# Patient Record
Sex: Female | Born: 1985 | Race: Black or African American | Hispanic: No | Marital: Single | Smoking: Current every day smoker
Health system: Southern US, Community
[De-identification: ages and names within clinical notes are randomized; demographics above are authoritative.]

## PROBLEM LIST (undated history)

## (undated) ENCOUNTER — Inpatient Hospital Stay (HOSPITAL_COMMUNITY): Payer: Self-pay

## (undated) DIAGNOSIS — F32A Depression, unspecified: Secondary | ICD-10-CM

## (undated) DIAGNOSIS — F419 Anxiety disorder, unspecified: Secondary | ICD-10-CM

## (undated) DIAGNOSIS — F329 Major depressive disorder, single episode, unspecified: Secondary | ICD-10-CM

## (undated) HISTORY — PX: CERVICAL BIOPSY  W/ LOOP ELECTRODE EXCISION: SUR135

---

## 2013-09-14 ENCOUNTER — Inpatient Hospital Stay
Admit: 2013-09-14 | Discharge: 2013-09-14 | Disposition: A | Discharge status: Home / Self Care | Attending: Emergency Medicine

## 2013-09-14 NOTE — ED Provider Notes (Signed)
Tina Berry, Tina Berry     DOS:           09/14/2013  MR #:             [id number]             ACCOUNT #:     [id number]  DATE OF BIRTH:    1985-05-02              AGE:           28      PROBLEM LIST:     N/V: Entered Date: 14-Sep-2013 14:42, Entered By: Carin Hock,  INTERFACES    HISTORY OF PRESENT ILLNESS:    PERTINENT HISTORY OF PRESENT  ILLNESS. Patient seen and discussed  with Dr.  Olen Pel.  Patient is a 28 year old female who presents with feeling weak,  decreased  appetite, increased urination, decreased focus the last 2-3 weeks..  Patient  was seen by the primary care doctor for which all the work up was done  as per  the patient and everything was negative except for rheumatoid factor  which was  mildly elevated.  Patient has a follow rheumatologist for the same.  Patient  has also been having some nausea and vomiting for the last one week  which the  patient states has been her best baseline considering her history of  acid  reflux.    PERTINENT PAST/ FAMILY/SOCIAL HISTORY Past medical history the  patient denies        PHYSICAL EXAM Vital signs unremarkable  Head normocephalic/atraumatic  Eyes pupils equally reactive extraocular movement intact  ENT tympanic membranes clear no rhinorrhea  Neck supple no lymphadenopathy  Cardiovascular regular rate and rhythm no murmur normal S1-S2  Respiratory no distress to auscultation bilaterally  Abdomen soft nontender nondistended  Back nontender  Extremities nontender no edema  Neuro alert oriented cranial is intact sensation intact motor normal  reflexes  normal  Psych patient has a flat effect    MEDICAL DECISION MAKING:    SIGNIFICANT FINDINGS/ED COURSE/MEDICAL DECISION MAKING/TREATMENT  PLAN The  patient's findings were very much suggestive of chronic fatigue  syndrome.  The  other differential that would consider for diabetes, pregnancy,  sexually  transmitted diseases.  Patient had a blood glucose which was 108 urine  hCG was  negative.  As  per the patient's request a urine was sent for [facility] and  Chlamydia  and the patient was told if it is positive she will be called.  The  patient was  asked to follow up with her doctor for further management.      DIAGNOSIS 1.  Polyuria  2.  Chronic fatigue      ADDITIONAL INFORMATION The Emergency Medicine attending physician  was present  in the Emergency Department, who reviewed case management, and  approved  evaluation/treatment.    COPIES SENT TO::     UnKnown, [id number](PCP): [id number]    Electronic Signatures:  Desma Maxim (MD)  (Signed 23-Sep-2013 13:17)   Co-Signer: PROBLEM LIST, HISTORY OF PRESENT ILLNESS, PHYSICAL EXAM,  MEDICAL  DECISION MAKING, DIAGNOSIS, Additional Infomation, Copies to be sent  to:  Rosalio Macadamia (MD)  (Signed 14-Sep-2013 18:08)   Authored: PROBLEM LIST, HISTORY OF PRESENT ILLNESS, PHYSICAL EXAM,  MEDICAL  DECISION MAKING, DIAGNOSIS, Additional Infomation, Copies to be sent  to:      Last Updated: 23-Sep-2013 13:17 by Desma Maxim (MD)  Please see T-Sheet, initial assessment, and physician orders for  further details.    Dictating Physician: Rosalio MacadamiaShailesh Khetarpal, MD  Original Electronic Signature Date: 09/14/2013 04:15 P  SK  Document #: [id number]    cc:  PCP No       Soarian

## 2013-10-31 LAB — POCT URINE PREGNANCY: Preg Test, Ur: NEGATIVE

## 2013-10-31 LAB — POCT URINALYSIS DIPSTICK W/O MICROSCOPE (AUTO)
Bilirubin, UA: NEGATIVE
Blood, UA POC: 200
Glucose, UA POC: NEGATIVE
Ketones, UA: 80
Leukocytes, UA: NEGATIVE
Nitrite, UA: NEGATIVE
Protein, UA POC: 30
Spec Grav, UA: 1.025
Urobilinogen, UA: 1
pH, UA: 6.5

## 2013-10-31 MED ORDER — CIPRO 500 MG PO TABS
500 | ORAL_TABLET | Freq: Two times a day (BID) | ORAL | Status: AC
Start: 2013-10-31 — End: 2013-11-05

## 2013-10-31 NOTE — Progress Notes (Signed)
Subjective:      Patient ID: Tina Berry is a 28 y.o. female.    HPI   1)  Here for possible UTI  Sxs for the past couple of weeks  + freq  + urg  + dysuria  no hematuria  No vaginal complaints: itch, discharge, bleeding  LMP: current  no abd pain - "growling, rumbling"  No back/flank pain  + feverish - chronic  + chills - chronic  + night sweats - chronic  No rash  + decreased appetite - "lost a lot of weight" - ~ 15 pounds in 30 days    + h/o UTI  Non-DM    2)  H/o IBS - bloating, constipation  Over the past week growling and rumbling    Review of Systems   Constitutional: Positive for fever, chills, diaphoresis, appetite change, fatigue and unexpected weight change.   Gastrointestinal: Positive for diarrhea, constipation and abdominal distention. Negative for nausea, vomiting, abdominal pain and blood in stool.   Endocrine:        Non-diabetic   Genitourinary: Positive for dysuria, urgency and frequency. Negative for hematuria, flank pain, vaginal bleeding, vaginal discharge, vaginal pain and pelvic pain.   Musculoskeletal: Negative for back pain.   Skin: Negative for rash.   Neurological: Negative for dizziness.   Hematological: Negative for adenopathy. Does not bruise/bleed easily.       Objective:   Physical Exam   Constitutional: She is oriented to person, place, and time. Vital signs are normal. She appears well-developed and well-nourished.  Non-toxic appearance. She does not appear ill.   Eyes: Conjunctivae are normal. No scleral icterus.   Neck: Neck supple.   Cardiovascular: Normal rate, regular rhythm and normal heart sounds.    Pulmonary/Chest: Effort normal and breath sounds normal. She has no wheezes. She has no rhonchi. She has no rales.   Abdominal: Soft. Normal appearance. She exhibits no distension and no mass. Bowel sounds are increased. There is no hepatosplenomegaly. There is tenderness in the suprapubic area. There is no rigidity, no rebound, no guarding and no CVA tenderness.    Genitourinary:   Exam deferred   Musculoskeletal: She exhibits no edema or tenderness.   Lymphadenopathy:     She has no cervical adenopathy.   Neurological: She is alert and oriented to person, place, and time.   Skin: Skin is warm and dry. No rash noted.   Psychiatric: She has a normal mood and affect. Her behavior is normal.   Nursing note and vitals reviewed.      Assessment:      1. Urinary frequency  POCT urine pregnancy    POCT Urinalysis No Micro (Auto)    ciprofloxacin (CIPRO) 500 MG TABS    Urine Culture    probable UTI   2. Weight loss  External Referral to Gastroenterology   3. IBS (irritable bowel syndrome)  External Referral to Gastroenterology            Plan:      As per orders           Kaveh Kissinger T Bessy Reaney        Follow up with PCP in 3-5 days. Call for appointment with your current PCP or please set up to establish with new PCP ASAP for appropriate follow up.

## 2013-11-02 LAB — CULTURE, URINE

## 2013-11-12 LAB — POCT URINALYSIS DIPSTICK W/O MICROSCOPE (AUTO)
Bilirubin, UA: NEGATIVE
Blood, UA POC: NEGATIVE
Glucose, UA POC: NEGATIVE
Ketones, UA: 5
Leukocytes, UA: NEGATIVE
Nitrite, UA: NEGATIVE
Protein, UA POC: NEGATIVE
Spec Grav, UA: 1.03
Urobilinogen, UA: 0.2
pH, UA: 6

## 2013-11-12 LAB — POCT URINE PREGNANCY: Preg Test, Ur: NEGATIVE

## 2013-11-12 MED ORDER — CIPROFLOXACIN HCL 500 MG PO TABS
500 MG | ORAL_TABLET | Freq: Two times a day (BID) | ORAL | Status: AC
Start: 2013-11-12 — End: 2013-11-19

## 2013-11-12 NOTE — Progress Notes (Signed)
[facility]  [facility]  [address]  [place] [id number]  Dept: [phone]  Dept Fax: [phone]  Loc: [phone]    Tina DutyBrittany L Berry is a 28 y.o. female who presents today for her medical conditions/complaints as noted below.  Tina Berry is c/o of Urinary Frequency      Chief Complaint   Patient presents with   ??? Urinary Frequency     X less than week, was seen on 07/14 for same issue.          HPI:     Urinary Frequency   This is a new problem. The current episode started 1 to 4 weeks ago. The problem occurs intermittently. The problem has been gradually worsening. The patient is experiencing no pain. Associated symptoms include frequency and urgency. Pertinent negatives include no chills, flank pain, hematuria, hesitancy, nausea, sweats or vomiting. She has tried nothing for the symptoms. The treatment provided no relief.       No past medical history on file.   Past Surgical History   Procedure Laterality Date   ??? Leep         No family history on file.    History   Substance Use Topics   ??? Smoking status: Current Every Day Smoker -- 0.80 packs/day     Types: Cigarettes   ??? Smokeless tobacco: Not on file   ??? Alcohol Use: Not on file      Current Outpatient Prescriptions   Medication Sig Dispense Refill   ??? BusPIRone HCl (BUSPAR PO) Take by mouth       ??? ciprofloxacin (CIPRO) 500 MG tablet Take 1 tablet by mouth 2 times daily for 7 days  14 tablet  0   ??? Cholecalciferol (VITAMIN D3) 1000 UNITS TABS     11   ??? Calcium Carbonate-Vitamin D (OYSTER SHELL CALCIUM/D) 500-200 MG-UNIT TABS     11   ??? Multiple Vitamin (MULTIVITAMINS PO) Take by mouth         No current facility-administered medications for this visit.     Allergies   Allergen Reactions   ??? Bactrim [Sulfamethoxazole-Tmp Ds]        Health Maintenance   Topic Date Due   ??? TETANUS VACCINE ADULT (11 YEARS AND UP)  05/24/1994   ??? CERVICAL CANCER SCREENING  05/24/2004   ??? FLU VACCINE YEARLY (ADULT)  12/19/2013        Subjective:      Review of Systems   Constitutional: Negative for chills.   Gastrointestinal: Negative for nausea and vomiting.   Genitourinary: Positive for urgency and frequency. Negative for hesitancy, hematuria and flank pain.       Objective:     Physical Exam   Constitutional: She is oriented to person, place, and time. She appears well-developed and well-nourished.   HENT:   Head: Normocephalic.   Eyes: Pupils are equal, round, and reactive to light.   Neck: Normal range of motion.   Cardiovascular: Normal rate.    Pulmonary/Chest: Effort normal.   Abdominal: Soft. There is no tenderness. There is no rebound and no guarding.   Musculoskeletal: Normal range of motion. She exhibits no tenderness.   Lymphadenopathy:     She has no cervical adenopathy.   Neurological: She is alert and oriented to person, place, and time.   Skin: Skin is warm and dry. No rash noted.     BP 145/81    Pulse 56  Temp(Src) 97.7 ??F (36.5 ??C) (Temporal)    Ht 5' (1.524 m)    Wt 118 lb (53.524 kg)    BMI 23.05 kg/m2      SpO2 99%    LMP 10/29/2013       Assessment:      The encounter diagnosis was Urinary frequency.    Plan:     Orders Placed This Encounter   Medications   ??? ciprofloxacin (CIPRO) 500 MG tablet     Sig: Take 1 tablet by mouth 2 times daily for 7 days     Dispense:  14 tablet     Refill:  0       Orders Placed This Encounter   Procedures   ??? Urine Culture     Order Specific Question:  Specify (ex-cath, midstream, cysto, etc)?     Answer:  midstream   ??? POCT urine pregnancy   ??? POCT Urinalysis No Micro (Auto)     To follow up with PCP, GYN or urologist if symptoms persist.  Explained if culture is negative again will need further eval by specialist  Patient given educational materials - see patient instructions.  Discussed use, benefit, and side effects of prescribed medications.  All patient questions answered.  Pt voiced understanding. Patient advised to follow up with primary care physician for all health  maintenance concerns and follow up as appropriate.  Instructed to continue current medications, diet and exercise.  Patient agreed with treatment plan. Follow up as directed.     Electronically signed by Juluis Rainierory A Hiral Lukasiewicz, DO on 11/12/2013 at 3:27 PM

## 2013-11-12 NOTE — Patient Instructions (Signed)
Frequent Urination: After Your Visit  Your Care Instructions  An urge to urinate frequently but usually passing only small amounts of urine is a common symptom of urinary problems, such as urinary tract infections. The bladder may become inflamed. This can cause the urge to urinate. You may try to urinate more often than usual to try to soothe that urge.  Frequent urination also may be caused by sexually transmitted infections (STIs) or kidney stones. Or it may happen when something irritates the tube that carries urine from the bladder to the outside of the body (urethra). It may also be a sign of diabetes.  The cause may be hard to find. You may need tests.  Follow-up care is a key part of your treatment and safety. Be sure to make and go to all appointments, and call your doctor if you are having problems. It's also a good idea to know your test results and keep a list of the medicines you take.  How can you care for yourself at home?  ?? Drink extra water and juices such as cranberry and blueberry juices for the next day or two. This will help make the urine less concentrated. And it may help wash out any bacteria that may be causing an infection. (If you have kidney, heart, or liver disease and have to limit fluids, talk with your doctor before you increase the amount of fluids you drink.)  ?? Avoid drinks that are carbonated or have caffeine. They can irritate the bladder.  For women:  ?? Urinate right after you have sex.  ?? After you go to the bathroom, wipe from front to back.  ?? Avoid douches, bubble baths, and feminine hygiene sprays. And avoid other feminine hygiene products that have deodorants.  When should you call for help?  Call your doctor now or seek immediate medical care if:  ?? You have new symptoms, such as fever, nausea, or vomiting.  ?? You have new or worse symptoms of a urinary problem. For example:  ?? You have blood or pus in your urine.  ?? You have chills or body aches.  ?? It hurts to  urinate.  ?? You have groin or belly pain.  ?? You have pain in your back just below your rib cage (the flank area).  Watch closely for changes in your health, and be sure to contact your doctor if you feel thirstier than usual.   Where can you learn more?   Go to [email or web address] and sign in to your MyChart account. Enter I431 in the Search Health Information box to learn more about ???Frequent Urination: After Your Visit.???    If you do not have an account, please click on the ???Sign Up Now??? link.     ?? 2006-2015 [employer]. Care instructions adapted under license by [facility]. This care instruction is for use with your licensed healthcare professional. If you have questions about a medical condition or this instruction, always ask your healthcare professional. Health[employer], Incorporated disclaims any warranty or liability for your use of this information.  Content Version: 10.5.422740; Current as of: December 27, 2012            To follow up with PCP, GYN or urologist if symptoms persist.

## 2013-11-14 LAB — CULTURE, URINE

## 2014-01-08 MED ORDER — PANTOPRAZOLE SODIUM 40 MG PO TBEC
40 MG | ORAL_TABLET | Freq: Every day | ORAL | Status: AC
Start: 2014-01-08 — End: ?

## 2014-01-08 NOTE — Progress Notes (Signed)
This patient presents with complaints of problems with stomach growling and movement in her epigastrium with rectal contractions.  She has had these for the last two months.  It does not matter if her stomach is empty or full. She has not had any testing for this.  They did not put the patent on any medications.  She also brought in an note from [employer] were she sees her PCP which I reviewed but there was not any additional information.  She does sometimes have vomiting without nausea in the morning after she awakens.  She just brings [place] a yellow clear fluid. She denies dysphagia or odynophagia.  She has not lost any weight but admits to having fluctuations in her weight. .  She denies any problems with her bowel movements. She has not had diarrhea, constipation or rectal bleeding.  I reviewed her referral note from Dr. Irean Hong .  He saw the patient on 10/31/13.  She complained of decreased appetite with a 15 pound weight loss in 30 days, growling and vomiting and her stomach and constipation and bloating.  She is referred to GI for this reason. When the patient is depressed she does not eat. She does take ibuprofen PM daily to help her to sleep. The patient has been checked for STD and HIV and tells me these were negative.  There is reference to this in her last visit with her primary caregiver.     Impression:  1.This patient presents with epigastric pain and vomiting.And weight loss.  She should have upper endoscopy to evaluate for peptic ulcer disease, gastritis, and gastric outlet obstruction. It is also possible this is functional in nature the patient does have a lot of depression and anxiety problems    Recommendations:  1.Upper endoscopy  with possible biopsy, polypectomy, dilation, control of bleeding.  I discussed with the patient about the risks, benefits, alternatives to this procedure to include discussion of the risks of bleeding, infection, perforation, aspiration, missed abnormality,  cardiopulmonary arrest, death, disability and possibility of need for emergency surgery her life support in the event of a complication.  I answered all of the patient's questions, they clearly understand all of the above and give there informed consent to proceed.  2.  Start pantoprazole 40 mg a day    Review of Systems:  HEENT, cardiac, pulmonary, GI, GU, or genitourinary, hematologic lymphatic, psychiatric, integumentary and constitutional review of systems negative except those issues this above    Physical Exam:  General: Well-developed well-nourished patient who is a alert and oriented x3.  HEENT: Head is normocephalic atraumatic Eyes with pupils equal and reactive to light and accommodation, sclera anicteric.  Nares without any lesions in the nares.  Mouth with moist mucous membranes no lesions of the hard and soft palate and posterior pharynx.  Neck supple, nontender, without any thyromegaly or lymphadenopathy.  Pulmonary: Lungs bilaterally normal to percussion and clear to auscultation  Cardiac: Heart is regular rate and rhythm without any rubs or clicks murmurs or JVD or edema  Abdomen: Abdomen is not distended and normal in appearance.  The abdomen has normal bowel sounds.  The abdomen is mildly tender to palpation in the epigastrium, it is soft without hepatosplenomegaly masses rebound or rigidity.  Rectal exam: Not performed at this time  Extremities: No lesions, edema  Skin: without any lesions  Neurologic: No focal deficits

## 2014-02-26 ENCOUNTER — Ambulatory Visit
Admit: 2014-02-26 | Discharge: 2014-02-26 | Payer: PRIVATE HEALTH INSURANCE | Attending: Rheumatology | Primary: Family Medicine

## 2014-02-26 DIAGNOSIS — M791 Myalgia, unspecified site: Secondary | ICD-10-CM

## 2014-02-26 MED ORDER — MELOXICAM 15 MG PO TABS
15 MG | ORAL_TABLET | Freq: Every day | ORAL | Status: AC
Start: 2014-02-26 — End: ?

## 2014-02-26 NOTE — Progress Notes (Signed)
Subjective:      Patient ID: Tina Berry is a 28 y.o. female.    HPI  28 yo F referred by Dr. Alfonse RasMoreno for "immune issues" and problems with blood.  She had labs done due to the onset of myalgias, arthralgias, hair loss and photosensitivity.  She denies any swollen joints, AM stiffness but her pain is now worse with the colder weather.  No h/o serositis, kidney issues, DVT/PE, miscarriages.  Uses Tylenol daily which helps but only for an hour or so.  Review of Systems  ROS as below is negative unless amended or stated in HPI:    Constitutional: weight gain, weight loss, fatigue, night sweats, fever  Eyes: pain, redness, loss of vision, double or blurred vision, +dryness, history of iritis of uveitis  Ears/Nose/Mouth/Throat:  sores in mouth, +dryness of mouth, difficulty swallowing, hoarseness, loss of hearing, nosebleeds, loss of smell, loss of taste, frequent sore throats  Cardiovascular: chest pain, high blood pressure, irregular heart beat, palpitations, heart murmurs  Respiratory: +shortness of breath, dyspnea on exertion, cough, hemoptysis, history of serositis  Gastrointestinal: nausea, vomiting, abdominal pain, diarrhea, constipation, blood in stool, heartburn  Genitourinary: blood in urine, rash/ulcers, difficulty urinating, sexual difficulties  Women only: vaginal dryness, history of miscarriages  Musculoskeletal: morning stiffness, +joint pain, joint swelling, joint stiffness, muscle tenderness  Skin: +easy bruising, rash, psoriasis, +photosensitivity, hair loss, color changes of hands and feet in the cold, hives  Neurologic: headaches, dizziness, weakness, numbness and tingling  Psychiatric: +anxiety, depression, +agitation, easily losing temper, +difficulty falling asleep, difficulty staying asleep  Hematologic/lymphatic: anemia, bleeding tendency, swollen glands  Immunologic: increased susceptibility to infection, recurrent infections      Objective:   Physical Exam   Constitutional: She is  oriented to person, place, and time. She appears well-developed and well-nourished.   HENT:   Head: Normocephalic and atraumatic.   Mouth/Throat: Mucous membranes are normal. No oral lesions. No oropharyngeal exudate.   Neck: Normal range of motion. Neck supple. No thyromegaly present.   Cardiovascular: Normal rate, regular rhythm and normal heart sounds.    Pulmonary/Chest: Effort normal and breath sounds normal.   Musculoskeletal: Normal range of motion.   No synovitis of any joints, TTP over PIPs R>L hands, minimal tenderness of some DIPs, no pain of MCPs, negative tender point survey   Neurological: She is alert and oriented to person, place, and time. She has normal strength.   Skin: Skin is warm and dry. No rash noted.   Psychiatric: She has a normal mood and affect. Her behavior is normal.       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

## 2014-02-27 LAB — COMPREHENSIVE METABOLIC PANEL
ALT: 26 U/L (ref 13–61)
AST: 17 U/L (ref 0–31)
Albumin,Serum: 4.1 G/dL (ref 3.4–5.0)
Albumin/Globulin Ratio: 1.2 RATIO (ref 1.0–2.5)
Alkaline Phosphatase: 56 U/L (ref 45–117)
Anion Gap: 5 mmol/L — ABNORMAL LOW (ref 6–16)
BUN/Creatinine Ratio: 18.9 RATIO (ref 6.0–20.0)
BUN: 17 mG/dL (ref 7–25)
CO2: 27 mmol/L (ref 21–31)
Calcium: 9.9 mG/dL (ref 8.2–10.5)
Chloride: 107 mmol/L (ref 98–109)
Creatinine: 0.9 mG/dL (ref 0.60–1.50)
GFR African American: >60 mL/min/{1.73_m2}
GFR Non-African American: >60 mL/min/{1.73_m2}
Globulin: 3.3 G/dL (ref 2.4–4.1)
Glucose: 92 mG/dL (ref 70–100)
Potassium: 4.8 mmol/L (ref 3.5–5.0)
Sodium: 139 mmol/L (ref 135–145)
Total Bilirubin: 0.3 mG/dL (ref 0.3–1.0)
Total Protein: 7.4 G/dL (ref 6.4–8.3)

## 2014-02-27 LAB — CBC WITH AUTO DIFFERENTIAL
Basophils %: 1 %
Basophils Absolute: 0.1 10*3/uL (ref 0.0–0.1)
Eosinophils %: 1.4 %
Eosinophils Absolute: 0.1 10*3/uL (ref 0.0–0.5)
Hematocrit: 43.5 % (ref 35–47)
Hemoglobin: 13.9 g/dl (ref 11.7–16.0)
Lymphocytes %: 26.5 %
Lymphocytes Absolute: 1.9 10*3/uL (ref 1.0–4.3)
MCH: 28.8 pg (ref 26–34)
MCHC: 31.9 g/dl — ABNORMAL LOW (ref 32–36)
MCV: 90.3 fL (ref 79–98)
MPV: 9.3 fL (ref 7.4–10.4)
Monocytes %: 8.8 %
Monocytes Absolute: 0.6 10*3/uL (ref 0.0–0.8)
Neutrophils Absolute: 4.5 10*3/uL (ref 1.8–7.0)
Platelets: 226 10*3/uL (ref 140–440)
RBC: 4.82 mil/uL (ref 3.8–5.2)
RDW: 13.9 % (ref 11.5–14.5)
Segs Relative: 62.3 %
WBC: 7.2 10*3/uL (ref 3.6–10.7)

## 2014-02-27 LAB — SEDIMENTATION RATE: Sed Rate: 8 mm/hr (ref 0–20)

## 2014-02-27 LAB — ANA: ANA: <1:40 {titer}

## 2014-02-27 LAB — RHEUMATOID FACTOR: Rheumatoid Factor: <10 IU/mL (ref 0–14)

## 2014-02-27 LAB — C4 COMPLEMENT: C4 Complement: 24.5 mG/dL (ref 10–40)

## 2014-02-27 LAB — URINALYSIS, MICRO
Epithelial Cells, UA: 0 /LPF
Erythrocytes, Urine: 0 /HPF (ref 0–3)
LEUKOCYTES, UA: 1 /HPF (ref 0–5)

## 2014-02-27 LAB — CK: Total CK: 183 U/L — ABNORMAL HIGH (ref 16–134)

## 2014-02-27 LAB — C-REACTIVE PROTEIN: wr-CRP: <1 mG/L (ref 0.0–5.0)

## 2014-02-27 LAB — C3 COMPLEMENT: C3 Complement: 107 mG/dL (ref 90–180)

## 2014-03-01 LAB — CYCLIC CITRUL PEPTIDE ANTIBODY, IGG: CC Peptide,IgG Ab: 2 Units (ref 0–19)

## 2014-03-01 LAB — EXTRACTABLE NUCLEAR AG ABS 5
ENA RNP Ab: 0 AU/mL (ref 0–40)
ENA SS-A IgG Ab Serum: 8 AU/mL (ref 0–40)
ENA SSA 60kD Ab: 32 AU/mL (ref 0–40)
SSB [La]: 0 AU/mL (ref 0–40)
Smith (ENA) Ab, IgG: 0 AU/mL (ref 0–40)

## 2014-03-01 LAB — ANTI-DNA ANTIBODY, DOUBLE-STRANDED: dsDNA Ab: NOT DETECTED

## 2014-03-20 NOTE — Telephone Encounter (Signed)
Pt notified of lab results.

## 2014-03-20 NOTE — Telephone Encounter (Signed)
Would like lab results

## 2014-03-20 NOTE — Telephone Encounter (Signed)
All tests negative/normal

## 2015-04-26 ENCOUNTER — Encounter (HOSPITAL_COMMUNITY): Payer: Self-pay

## 2015-04-26 ENCOUNTER — Emergency Department (HOSPITAL_COMMUNITY)
Admission: EM | Admit: 2015-04-26 | Discharge: 2015-04-27 | Discharge status: Against Medical Advice | Payer: Medicaid - Out of State | Attending: Emergency Medicine | Admitting: Emergency Medicine

## 2015-04-26 DIAGNOSIS — R1110 Vomiting, unspecified: Secondary | ICD-10-CM | POA: Insufficient documentation

## 2015-04-26 DIAGNOSIS — R52 Pain, unspecified: Secondary | ICD-10-CM | POA: Insufficient documentation

## 2015-04-26 DIAGNOSIS — F17210 Nicotine dependence, cigarettes, uncomplicated: Secondary | ICD-10-CM | POA: Diagnosis not present

## 2015-04-26 DIAGNOSIS — R350 Frequency of micturition: Secondary | ICD-10-CM | POA: Diagnosis not present

## 2015-04-26 DIAGNOSIS — Z5321 Procedure and treatment not carried out due to patient leaving prior to being seen by health care provider: Secondary | ICD-10-CM

## 2015-04-26 LAB — URINALYSIS, ROUTINE W REFLEX MICROSCOPIC
Bilirubin Urine: NEGATIVE
GLUCOSE, UA: NEGATIVE mg/dL
Ketones, ur: 15 mg/dL — AB
LEUKOCYTES UA: NEGATIVE
NITRITE: NEGATIVE
PH: 6 (ref 5.0–8.0)
PROTEIN: NEGATIVE mg/dL
Specific Gravity, Urine: 1.004 — ABNORMAL LOW (ref 1.005–1.030)

## 2015-04-26 LAB — URINE MICROSCOPIC-ADD ON

## 2015-04-26 NOTE — ED Notes (Signed)
Second call for blood draw,no answer.

## 2015-04-26 NOTE — ED Notes (Signed)
No answer for blood draw at this time. 

## 2015-04-26 NOTE — ED Notes (Signed)
Pt c/o generalized body aches, urinary frequency, and emesis after eating (50% of the time) x 3 days.  Pain score 5/10.  Pt has not taken anything for symptoms.  Denies abdominal pain.

## 2015-05-02 NOTE — ED Provider Notes (Signed)
Per review of the nursing notes it seems the patient left without being seen.   Marily MemosJason Amiel Mccaffrey, MD 05/02/15 1630

## 2016-04-20 NOTE — L&D Delivery Note (Signed)
Delivery Note At 2:44 PM a viable female was delivered via Vaginal, Spontaneous Delivery (Presentation:OA)  APGAR: 6/8 per NICU. Delivered without difficulty over an intact perineum.  3VC, Placenta intact, sent to pathology.    Anesthesia:  epidural Episiotomy: None Lacerations: None Est. Blood Loss (mL): 50 Fundus firm Mom to postpartum.  Baby to NICU.  Kathlene NovemberCinthya Bowmaker-Kareem, SNM 02/14/2017, 2:59 PM I was present for this delivery and agree with above assessment

## 2016-08-06 DIAGNOSIS — F17210 Nicotine dependence, cigarettes, uncomplicated: Secondary | ICD-10-CM | POA: Diagnosis not present

## 2016-08-06 DIAGNOSIS — Z79899 Other long term (current) drug therapy: Secondary | ICD-10-CM | POA: Diagnosis not present

## 2016-08-06 DIAGNOSIS — H1089 Other conjunctivitis: Secondary | ICD-10-CM | POA: Insufficient documentation

## 2016-08-06 DIAGNOSIS — H109 Unspecified conjunctivitis: Secondary | ICD-10-CM | POA: Diagnosis present

## 2016-08-07 ENCOUNTER — Emergency Department (HOSPITAL_COMMUNITY)
Admission: EM | Admit: 2016-08-07 | Discharge: 2016-08-07 | Disposition: A | Discharge status: Home / Self Care | Payer: Medicaid Other | Attending: Emergency Medicine | Admitting: Emergency Medicine

## 2016-08-07 ENCOUNTER — Encounter (HOSPITAL_COMMUNITY): Payer: Self-pay | Admitting: Emergency Medicine

## 2016-08-07 DIAGNOSIS — H109 Unspecified conjunctivitis: Secondary | ICD-10-CM

## 2016-08-07 MED ORDER — TETRACAINE HCL 0.5 % OP SOLN
2.0000 [drp] | Freq: Once | OPHTHALMIC | Status: AC
  Administered 2016-08-07: 2 [drp] via OPHTHALMIC
  Filled 2016-08-07: qty 2

## 2016-08-07 MED ORDER — FLUORESCEIN SODIUM 0.6 MG OP STRP
1.0000 | ORAL_STRIP | Freq: Once | OPHTHALMIC | Status: AC
  Administered 2016-08-07: 1 via OPHTHALMIC
  Filled 2016-08-07: qty 1

## 2016-08-07 MED ORDER — ERYTHROMYCIN 5 MG/GM OP OINT: TOPICAL_OINTMENT | OPHTHALMIC | 0 refills | Status: DC

## 2016-08-07 NOTE — ED Notes (Signed)
Pt has been using stye medication with no relief.

## 2016-08-07 NOTE — ED Triage Notes (Addendum)
Patient reports right eye redness/irritation with drainage and lacrimation onset yesterday morning , denies injury or vision loss. Patient stated that she is [redacted] weeks pregnant.

## 2016-08-07 NOTE — ED Provider Notes (Signed)
[facility]-EMERGENCY DEPT Provider Note   CSN: [id number] Arrival date & time: 08/06/16  2356     History   Chief Complaint Chief Complaint  Patient presents with  . Conjunctivitis    HPI Erica Kelly is a 31 y.o. female.  Pt presents w acute onset R eye burning that began early Thursday morning. Pt reports she attempted to put in her contact lense and immediately felt a burning sensation in her entire eye. Reports R eye is red and she has had yellow mucus drainage from that eye. Denies vision loss or changes in vision, pain w eye movement, HA, F/C. She is currently [redacted] weeks pregnant.      History reviewed. No pertinent past medical history.  There are no active problems to display for this patient.   Past Surgical History:  Procedure Laterality Date  . CERVICAL BIOPSY  W/ LOOP ELECTRODE EXCISION      OB History    Gravida Para Term Preterm AB Living   1             SAB TAB Ectopic Multiple Live Births                   Home Medications    Prior to Admission medications   Medication Sig Start Date End Date Taking? Authorizing Provider  erythromycin ophthalmic ointment Place a 1/2 inch ribbon of ointment into the lower eyelid 4 times daily for 7 days. 08/07/16   [place] N Russo, PA-C  Multiple Vitamin (MULTIVITAMIN WITH MINERALS) TABS tablet Take 1 tablet by mouth daily.    Historical Provider, MD  polyvinyl alcohol (LIQUIFILM TEARS) 1.4 % ophthalmic solution Place 1 drop into both eyes daily as needed for dry eyes.    Historical Provider, MD    Family History No family history on file.  Social History Social History  Substance Use Topics  . Smoking status: Current Every Day Smoker    Types: Cigarettes  . Smokeless tobacco: Never Used  . Alcohol use Yes     Allergies   Bactrim [sulfamethoxazole-trimethoprim]   Review of Systems Review of Systems  Constitutional: Negative for chills and fever.  Eyes: Positive for pain (R eye, burning), discharge  (yellow, mucus) and redness (R eye). Negative for photophobia and visual disturbance.     Physical Exam Updated Vital Signs BP 121/75 (BP Location: Right Arm)   Pulse 84   Temp 99 F (37.2 C) (Oral)   Resp 18   SpO2 99%   Physical Exam  Constitutional: She appears well-developed and well-nourished.  HENT:  Head: Normocephalic and atraumatic.  Eyes: EOM are normal. Pupils are equal, round, and reactive to light. Lids are everted and swept, no foreign bodies found. Right eye exhibits discharge. Right eye exhibits no hordeolum. No foreign body present in the right eye. Left eye exhibits no discharge and no exudate. Right conjunctiva is injected. Right eye exhibits normal extraocular motion. Left eye exhibits normal extraocular motion.  Wood's lamp and fluorescein staining showed no evidence of corneal abrasion or ulcer. No foreign bodies.  Cardiovascular: Normal rate.   Pulmonary/Chest: Effort normal.  Psychiatric: She has a normal mood and affect. Her behavior is normal.  Nursing note and vitals reviewed.    ED Treatments / Results  Labs (all labs ordered are listed, but only abnormal results are displayed) Labs Reviewed - No data to display  EKG  EKG Interpretation None       Radiology No results found.  Procedures Procedures (including  critical care time)  Medications Ordered in ED Medications  fluorescein ophthalmic strip 1 strip (1 strip Right Eye Given by Other 08/07/16 0201)  tetracaine (PONTOCAINE) 0.5 % ophthalmic solution 2 drop (2 drops Right Eye Given by Other 08/07/16 0201)     Initial Impression / Assessment and Plan / ED Course  I have reviewed the triage vital signs and the nursing notes.  Pertinent labs & imaging results that were available during my care of the patient were reviewed by me and considered in my medical decision making (see chart for details).     Patient presentation consistent with bacterial conjunctivitis.  Purulent discharge  noted.  No corneal abrasions, entrapment, consensual photophobia, or dendritic staining with fluorescein study.  Presentation non-concerning for iritis, corneal abrasions, or HSV.  Pt is a contact lense wearer. Due to pt's pregnancy and bactrim allergy, prescribed erythromycin ophthalmic pointment. Personal hygiene and frequent handwashing discussed.  Patient advised to followup with ophthalmologist if symptoms persist or worsen in any way including vision change or purulent discharge.  Patient verbalizes understanding and is agreeable with discharge.  Discussed results, findings, treatment and follow up. Patient advised of return precautions. Patient verbalized understanding and agreed with plan.    Final Clinical Impressions(s) / ED Diagnoses   Final diagnoses:  Bacterial conjunctivitis of right eye    New Prescriptions Discharge Medication List as of 08/07/2016  2:15 AM    START taking these medications   Details  erythromycin ophthalmic ointment Place a 1/2 inch ribbon of ointment into the lower eyelid 4 times daily for 7 days., Print         [place] N Russo, PA-C 08/07/16 [id number]    Jacalyn Lefevre, MD 08/07/16 [phone]

## 2016-08-07 NOTE — Discharge Instructions (Signed)
Please read instructions below. Apply 1/2inch ribbon on ointment to the lower lid of your right eye 4 times per day. Maintain good hand hygiene. Do not wear your contact lenses until resolved. Discard your current pair of contact lenses and do not wear them again. Return to ER for vision loss, worsening symptoms, fever.

## 2016-08-14 ENCOUNTER — Encounter (HOSPITAL_COMMUNITY): Payer: Self-pay

## 2016-08-14 ENCOUNTER — Inpatient Hospital Stay (HOSPITAL_COMMUNITY)
Admission: AD | Admit: 2016-08-14 | Discharge: 2016-08-14 | Disposition: A | Discharge status: Home / Self Care | Payer: Medicaid Other | Source: Ambulatory Visit | Attending: Obstetrics and Gynecology | Admitting: Obstetrics and Gynecology

## 2016-08-14 ENCOUNTER — Inpatient Hospital Stay (HOSPITAL_COMMUNITY): Payer: Medicaid Other

## 2016-08-14 DIAGNOSIS — Z882 Allergy status to sulfonamides status: Secondary | ICD-10-CM | POA: Insufficient documentation

## 2016-08-14 DIAGNOSIS — Z3A01 Less than 8 weeks gestation of pregnancy: Secondary | ICD-10-CM | POA: Insufficient documentation

## 2016-08-14 DIAGNOSIS — G4700 Insomnia, unspecified: Secondary | ICD-10-CM | POA: Diagnosis not present

## 2016-08-14 DIAGNOSIS — O99352 Diseases of the nervous system complicating pregnancy, second trimester: Secondary | ICD-10-CM | POA: Diagnosis not present

## 2016-08-14 DIAGNOSIS — R0602 Shortness of breath: Secondary | ICD-10-CM | POA: Diagnosis present

## 2016-08-14 DIAGNOSIS — O26892 Other specified pregnancy related conditions, second trimester: Secondary | ICD-10-CM | POA: Diagnosis not present

## 2016-08-14 DIAGNOSIS — O26899 Other specified pregnancy related conditions, unspecified trimester: Secondary | ICD-10-CM | POA: Diagnosis not present

## 2016-08-14 DIAGNOSIS — R109 Unspecified abdominal pain: Secondary | ICD-10-CM

## 2016-08-14 DIAGNOSIS — Z3491 Encounter for supervision of normal pregnancy, unspecified, first trimester: Secondary | ICD-10-CM

## 2016-08-14 HISTORY — DX: Anxiety disorder, unspecified: F41.9

## 2016-08-14 LAB — URINALYSIS, ROUTINE W REFLEX MICROSCOPIC
Bilirubin Urine: NEGATIVE
GLUCOSE, UA: NEGATIVE mg/dL
Hgb urine dipstick: NEGATIVE
KETONES UR: 80 mg/dL — AB
LEUKOCYTES UA: NEGATIVE
Nitrite: NEGATIVE
PROTEIN: NEGATIVE mg/dL
Specific Gravity, Urine: 1.026 (ref 1.005–1.030)
pH: 6 (ref 5.0–8.0)

## 2016-08-14 LAB — CBC
HCT: 40.9 % (ref 36.0–46.0)
Hemoglobin: 14.3 g/dL (ref 12.0–15.0)
MCH: 30.2 pg (ref 26.0–34.0)
MCHC: 35 g/dL (ref 30.0–36.0)
MCV: 86.3 fL (ref 78.0–100.0)
PLATELETS: 227 10*3/uL (ref 150–400)
RBC: 4.74 MIL/uL (ref 3.87–5.11)
RDW: 13.2 % (ref 11.5–15.5)
WBC: 9.8 10*3/uL (ref 4.0–10.5)

## 2016-08-14 LAB — HEPATITIS B SURFACE ANTIGEN: Hepatitis B Surface Ag: NEGATIVE

## 2016-08-14 LAB — HCG, QUANTITATIVE, PREGNANCY: hCG, Beta Chain, Quant, S: 70962 m[IU]/mL — ABNORMAL HIGH (ref <5)

## 2016-08-14 LAB — WET PREP, GENITAL
Clue Cells Wet Prep HPF POC: NONE SEEN
Sperm: NONE SEEN
Trich, Wet Prep: NONE SEEN
Yeast Wet Prep HPF POC: NONE SEEN

## 2016-08-14 LAB — POCT PREGNANCY, URINE: Preg Test, Ur: POSITIVE — AB

## 2016-08-14 LAB — ABO/RH: ABO/RH(D): B POS
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
## 2016-08-14 NOTE — Discharge Instructions (Signed)
Insomnia Insomnia is a sleep disorder that makes it difficult to fall asleep or to stay asleep. Insomnia can cause tiredness (fatigue), low energy, difficulty concentrating, mood swings, and poor performance at work or school. There are three different ways to classify insomnia:  Difficulty falling asleep.  Difficulty staying asleep.  Waking up too early in the morning. Any type of insomnia can be long-term (chronic) or short-term (acute). Both are common. Short-term insomnia usually lasts for three months or less. Chronic insomnia occurs at least three times a week for longer than three months. What are the causes? Insomnia may be caused by another condition, situation, or substance, such as:  Anxiety.  Certain medicines.  Gastroesophageal reflux disease (GERD) or other gastrointestinal conditions.  Asthma or other breathing conditions.  Restless legs syndrome, sleep apnea, or other sleep disorders.  Chronic pain.  Menopause. This may include hot flashes.  Stroke.  Abuse of alcohol, tobacco, or illegal drugs.  Depression.  Caffeine.  Neurological disorders, such as Alzheimer disease.  An overactive thyroid (hyperthyroidism). The cause of insomnia may not be known. What increases the risk? Risk factors for insomnia include:  Gender. Women are more commonly affected than men.  Age. Insomnia is more common as you get older.  Stress. This may involve your professional or personal life.  Income. Insomnia is more common in people with lower income.  Lack of exercise.  Irregular work schedule or night shifts.  Traveling between different time zones. What are the signs or symptoms? If you have insomnia, trouble falling asleep or trouble staying asleep is the main symptom. This may lead to other symptoms, such as:  Feeling fatigued.  Feeling nervous about going to sleep.  Not feeling rested in the morning.  Having trouble concentrating.  Feeling irritable,  anxious, or depressed. How is this treated? Treatment for insomnia depends on the cause. If your insomnia is caused by an underlying condition, treatment will focus on addressing the condition. Treatment may also include:  Medicines to help you sleep.  Counseling or therapy.  Lifestyle adjustments. Follow these instructions at home:  Take medicines only as directed by your health care provider.  Keep regular sleeping and waking hours. Avoid naps.  Keep a sleep diary to help you and your health care provider figure out what could be causing your insomnia. Include:  When you sleep.  When you wake up during the night.  How well you sleep.  How rested you feel the next day.  Any side effects of medicines you are taking.  What you eat and drink.  Make your bedroom a comfortable place where it is easy to fall asleep:  Put up shades or special blackout curtains to block light from outside.  Use a white noise machine to block noise.  Keep the temperature cool.  Exercise regularly as directed by your health care provider. Avoid exercising right before bedtime.  Use relaxation techniques to manage stress. Ask your health care provider to suggest some techniques that may work well for you. These may include:  Breathing exercises.  Routines to release muscle tension.  Visualizing peaceful scenes.  Cut back on alcohol, caffeinated beverages, and cigarettes, especially close to bedtime. These can disrupt your sleep.  Do not overeat or eat spicy foods right before bedtime. This can lead to digestive discomfort that can make it hard for you to sleep.  Limit screen use before bedtime. This includes:  Watching TV.  Using your smartphone, tablet, and computer.  Stick to a   routine. This can help you fall asleep faster. Try to do a quiet activity, brush your teeth, and go to bed at the same time each night.  Get out of bed if you are still awake after 15 minutes of trying to  sleep. Keep the lights down, but try reading or doing a quiet activity. When you feel sleepy, go back to bed.  Make sure that you drive carefully. Avoid driving if you feel very sleepy.  Keep all follow-up appointments as directed by your health care provider. This is important. Contact a health care provider if:  You are tired throughout the day or have trouble in your daily routine due to sleepiness.  You continue to have sleep problems or your sleep problems get worse. Get help right away if:  You have serious thoughts about hurting yourself or someone else. This information is not intended to replace advice given to you by your health care provider. Make sure you discuss any questions you have with your health care provider. Document Released: 04/03/2000 Document Revised: 09/06/2015 Document Reviewed: 01/05/2014 Elsevier Interactive Patient Education  2017 [employer]  

## 2016-08-14 NOTE — MAU Note (Signed)
Pt presents to MAU with complaints of lower abdominal cramping, and states that she feels like she is not able to concentrate. PT also states she feels short of breath. Denies any vaginal bleeding or abnormal discharge

## 2016-08-15 ENCOUNTER — Encounter (HOSPITAL_COMMUNITY): Payer: Self-pay | Admitting: Emergency Medicine

## 2016-08-15 ENCOUNTER — Emergency Department (HOSPITAL_COMMUNITY)
Admission: EM | Admit: 2016-08-15 | Discharge: 2016-08-16 | Disposition: A | Discharge status: Home / Self Care | Payer: Medicaid Other | Attending: Emergency Medicine | Admitting: Emergency Medicine

## 2016-08-15 DIAGNOSIS — F17210 Nicotine dependence, cigarettes, uncomplicated: Secondary | ICD-10-CM | POA: Insufficient documentation

## 2016-08-15 DIAGNOSIS — R0601 Orthopnea: Secondary | ICD-10-CM | POA: Insufficient documentation

## 2016-08-15 DIAGNOSIS — R072 Precordial pain: Secondary | ICD-10-CM | POA: Insufficient documentation

## 2016-08-15 DIAGNOSIS — Z79899 Other long term (current) drug therapy: Secondary | ICD-10-CM | POA: Diagnosis not present

## 2016-08-15 DIAGNOSIS — R0602 Shortness of breath: Secondary | ICD-10-CM | POA: Diagnosis present

## 2016-08-15 LAB — HIV ANTIBODY (ROUTINE TESTING W REFLEX): HIV Screen 4th Generation wRfx: NONREACTIVE

## 2016-08-15 LAB — RPR: RPR Ser Ql: NONREACTIVE

## 2016-08-15 NOTE — ED Triage Notes (Signed)
Pt from [facility] (due to S/I and depression) states she has had SOB and "chest tightness" for three days when she lies down.  At this time she is not labored in her breathing.  She reports she is [redacted]weeks pregnant.

## 2016-08-16 ENCOUNTER — Emergency Department (HOSPITAL_COMMUNITY): Payer: Medicaid Other

## 2016-08-16 LAB — BASIC METABOLIC PANEL
ANION GAP: 11 (ref 5–15)
BUN: 7 mg/dL (ref 6–20)
CHLORIDE: 107 mmol/L (ref 101–111)
CO2: 17 mmol/L — ABNORMAL LOW (ref 22–32)
Calcium: 8.9 mg/dL (ref 8.9–10.3)
Creatinine, Ser: 0.63 mg/dL (ref 0.44–1.00)
GFR calc Af Amer: >60 mL/min (ref >60)
GFR calc non Af Amer: >60 mL/min (ref >60)
Glucose, Bld: 81 mg/dL (ref 65–99)
POTASSIUM: 3.5 mmol/L (ref 3.5–5.1)
SODIUM: 135 mmol/L (ref 135–145)

## 2016-08-16 LAB — CBC
HCT: 38.4 % (ref 36.0–46.0)
HEMOGLOBIN: 13.3 g/dL (ref 12.0–15.0)
MCH: 29.4 pg (ref 26.0–34.0)
MCHC: 34.6 g/dL (ref 30.0–36.0)
MCV: 84.8 fL (ref 78.0–100.0)
Platelets: 227 10*3/uL (ref 150–400)
RBC: 4.53 MIL/uL (ref 3.87–5.11)
RDW: 12.6 % (ref 11.5–15.5)
WBC: 11 10*3/uL — AB (ref 4.0–10.5)

## 2016-08-16 LAB — I-STAT TROPONIN, ED: Troponin i, poc: 0 ng/mL (ref 0.00–0.08)

## 2016-08-16 MED ORDER — IBUPROFEN 600 MG PO TABS: 600.0000 mg | ORAL_TABLET | Freq: Three times a day (TID) | ORAL | 0 refills | Status: DC

## 2016-08-16 MED ORDER — ALBUTEROL SULFATE HFA 108 (90 BASE) MCG/ACT IN AERS
2.0000 | INHALATION_SPRAY | Freq: Once | RESPIRATORY_TRACT | Status: AC
  Administered 2016-08-16: 2 via RESPIRATORY_TRACT
  Filled 2016-08-16: qty 6.7

## 2016-08-16 NOTE — ED Notes (Signed)
Pulse Ox during ambulation remained at 100%

## 2016-08-16 NOTE — ED Notes (Signed)
Called and gave report to [facility] and [facility] for transport.

## 2016-08-16 NOTE — Discharge Instructions (Signed)
Read the information below.  Use the prescribed medication as directed.  Please discuss all new medications with your pharmacist.  You may return to the Emergency Department at any time for worsening condition or any new symptoms that concern you.   If you develop worsening chest pain, shortness of breath, fever, you pass out, or become weak or dizzy, return to the ER for a recheck.     Please use your inhaler as needed over the next few days.  If you continue to have pain after using the inhaler, use the prescribed medication and make a follow up appointment with the cardiologist.

## 2016-08-16 NOTE — ED Provider Notes (Signed)
[facility]-EMERGENCY DEPT Provider Note   CSN: [id number] Arrival date & time: 08/15/16  2324     History   Chief Complaint Chief Complaint  Patient presents with  . Shortness of Breath    HPI Erica Kelly is a 31 y.o. female.  HPI   Pregnant pt (7 wks, confirmed IUP) currently at [facility] for depression and SI p/w 3 days of chest tightness and SOB.  States the symptoms are significantly worse with lying flat.  She is not SOB with sitting upright but the chest tightness is unchanged.  The tightness is not painful and is located in the central anterior chest.  Denies fevers, chills, myalgias, cough, leg swelling.  She is a smoker, currently attempting to quit.    Pregnancy recently confirmed IUP, denies abdominal pain, vaginal bleeding or discharge.    Past Medical History:  Diagnosis Date  . Anxiety    saw counselor in [place]    There are no active problems to display for this patient.   Past Surgical History:  Procedure Laterality Date  . CERVICAL BIOPSY  W/ LOOP ELECTRODE EXCISION      OB History    Gravida Para Term Preterm AB Living   2       1     SAB TAB Ectopic Multiple Live Births     1             Home Medications    Prior to Admission medications   Medication Sig Start Date End Date Taking? Authorizing Provider  acetaminophen (TYLENOL) 500 MG tablet Take 1,000 mg by mouth every 6 (six) hours as needed for mild pain or headache.   Yes Historical Provider, MD  diphenhydrAMINE (BENADRYL) 25 MG tablet Take 50 mg by mouth at bedtime as needed for sleep.   Yes Historical Provider, MD  Prenatal Vit-Fe Fumarate-FA (PRENATAL MULTIVITAMIN) TABS tablet Take 1 tablet by mouth daily at 12 noon.   Yes Historical Provider, MD  ibuprofen (ADVIL,MOTRIN) 600 MG tablet Take 1 tablet (600 mg total) by mouth every 8 (eight) hours. 08/16/16   [name], PA-C    Family History Family History  Problem Relation Age of Onset  . Diabetes Mother   . Hypertension Mother   .  Diabetes Father   . Hypertension Father     Social History Social History  Substance Use Topics  . Smoking status: Current Every Day Smoker    Types: Cigarettes  . Smokeless tobacco: Never Used  . Alcohol use Yes     Allergies   Bactrim [sulfamethoxazole-trimethoprim]   Review of Systems Review of Systems  All other systems reviewed and are negative.    Physical Exam Updated Vital Signs BP 111/84   Pulse (!) 54   Temp 99 F (37.2 C) (Oral)   Resp 20   LMP 06/23/2016   SpO2 100%   Physical Exam  Constitutional: She appears well-developed and well-nourished. No distress.  thin  HENT:  Head: Normocephalic and atraumatic.  Neck: Neck supple.  Cardiovascular: Normal rate and regular rhythm.   Pulmonary/Chest: Effort normal and breath sounds normal. No respiratory distress. She has no wheezes. She has no rales.  Abdominal: Soft. She exhibits no distension. There is no tenderness. There is no rebound and no guarding.  Musculoskeletal: She exhibits no edema.  Neurological: She is alert.  Skin: She is not diaphoretic.  Nursing note and vitals reviewed.    ED Treatments / Results  Labs (all labs ordered are listed, but only  abnormal results are displayed) Labs Reviewed  BASIC METABOLIC PANEL - Abnormal; Notable for the following:       Result Value   CO2 17 (*)    All other components within normal limits  CBC - Abnormal; Notable for the following:    WBC 11.0 (*)    All other components within normal limits  I-STAT TROPOININ, ED    EKG  EKG Interpretation  Date/Time:  Sunday August 16 2016 02:24:39 EDT Ventricular Rate:  86 PR Interval:    QRS Duration: 75 QT Interval:  363 QTC Calculation: 435 R Axis:   65 Text Interpretation:  Sinus rhythm Sinus pause Probable left atrial enlargement ST elevation suggests acute pericarditis Confirmed by Erica Shaggy  MD, DONALD ([id number]) on 08/16/2016 2:29:09 AM       Radiology Dg Chest 2 View  Result Date:  08/16/2016 CLINICAL DATA:  31 year old female with chest tightness and pain. EXAM: CHEST  2 VIEW COMPARISON:  None. FINDINGS: The heart size and mediastinal contours are within normal limits. Both lungs are clear. The visualized skeletal structures are unremarkable. IMPRESSION: No active cardiopulmonary disease. Electronically Signed   By: Elgie Collard M.D.   On: 08/16/2016 01:59    Procedures Procedures (including critical care time)  Medications Ordered in ED Medications  albuterol (PROVENTIL HFA;VENTOLIN HFA) 108 (90 Base) MCG/ACT inhaler 2 puff (2 puffs Inhalation Given 08/16/16 0254)     Initial Impression / Assessment and Plan / ED Course  I have reviewed the triage vital signs and the nursing notes.  Pertinent labs & imaging results that were available during my care of the patient were reviewed by me and considered in my medical decision making (see chart for details).  Clinical Course as of Aug 17 2310  Sun Aug 16, 2016  0244 Maintained 100% O2 while ambulating.  [EW]    Clinical Course User Index [EW] [name], PA-C   Afebrile, nontoxic patient with chest tightness and SOB, worse with lying flat x 3 days.  Labs significant for mild leukocytosis.  CXR negative. EKG concerning for pericarditis.     Doubt myocarditis, large paricardial effusion.  Clinically no cardiac tamponade.  Pt ambulated with 100% O2.  Per Up To Date, pt should be given motrin or aspirin as she is <[redacted] weeks gestation.  Discussed pt with Dr Erica Kelly.  Pt insistent on having albuterol inhaler - I have discussed with her safety and use of medications in pregnancy.  I do not hear wheezing but will give albuterol hfa to see if pt feels better with it.  D/C home with motrin, albuterol, cardiology follow up.  PCP/obgyn follow up.  Discussed result, findings, treatment, and follow up  with patient.  Pt given return precautions.  Pt verbalizes understanding and agrees with plan.       Final Clinical Impressions(s) /  ED Diagnoses   Final diagnoses:  Precordial pain  Orthopnea    New Prescriptions Discharge Medication List as of 08/16/2016  2:56 AM    START taking these medications   Details  ibuprofen (ADVIL,MOTRIN) 600 MG tablet Take 1 tablet (600 mg total) by mouth every 8 (eight) hours., Starting Sun 08/16/2016, Print         [place], PA-C 08/16/16 2315    Zadie Rhine, MD 08/17/16 [id number]

## 2016-08-17 LAB — CERVICOVAGINAL ANCILLARY ONLY
CHLAMYDIA, DNA PROBE: NEGATIVE
NEISSERIA GONORRHEA: NEGATIVE

## 2016-09-11 DIAGNOSIS — Z3490 Encounter for supervision of normal pregnancy, unspecified, unspecified trimester: Secondary | ICD-10-CM | POA: Insufficient documentation

## 2016-09-15 ENCOUNTER — Encounter: Payer: Self-pay | Admitting: Obstetrics and Gynecology

## 2016-09-15 ENCOUNTER — Ambulatory Visit (INDEPENDENT_AMBULATORY_CARE_PROVIDER_SITE_OTHER): Payer: Medicaid Other | Admitting: Obstetrics and Gynecology

## 2016-09-15 ENCOUNTER — Other Ambulatory Visit (HOSPITAL_COMMUNITY)
Admission: RE | Admit: 2016-09-15 | Discharge: 2016-09-15 | Disposition: A | Discharge status: Home / Self Care | Payer: Medicaid Other | Source: Ambulatory Visit | Attending: Obstetrics and Gynecology | Admitting: Obstetrics and Gynecology

## 2016-09-15 VITALS — BP 129/76 | HR 94 | Wt 117.0 lb

## 2016-09-15 DIAGNOSIS — O3440 Maternal care for other abnormalities of cervix, unspecified trimester: Secondary | ICD-10-CM | POA: Insufficient documentation

## 2016-09-15 DIAGNOSIS — Z9889 Other specified postprocedural states: Secondary | ICD-10-CM

## 2016-09-15 DIAGNOSIS — O99341 Other mental disorders complicating pregnancy, first trimester: Secondary | ICD-10-CM

## 2016-09-15 DIAGNOSIS — O0991 Supervision of high risk pregnancy, unspecified, first trimester: Secondary | ICD-10-CM

## 2016-09-15 DIAGNOSIS — Z3490 Encounter for supervision of normal pregnancy, unspecified, unspecified trimester: Secondary | ICD-10-CM | POA: Insufficient documentation

## 2016-09-15 DIAGNOSIS — F319 Bipolar disorder, unspecified: Secondary | ICD-10-CM | POA: Diagnosis not present

## 2016-09-15 DIAGNOSIS — Z3A00 Weeks of gestation of pregnancy not specified: Secondary | ICD-10-CM | POA: Diagnosis not present

## 2016-09-15 DIAGNOSIS — O3441 Maternal care for other abnormalities of cervix, first trimester: Secondary | ICD-10-CM

## 2016-09-15 NOTE — Progress Notes (Signed)
NOB pt requests Nicotine suppressants. Pap due.

## 2016-09-15 NOTE — Patient Instructions (Signed)
First Trimester of Pregnancy The first trimester of pregnancy is from week 1 until the end of week 13 (months 1 through 3). A week after a sperm fertilizes an egg, the egg will implant on the wall of the uterus. This embryo will begin to develop into a baby. Genes from you and your partner will form the baby. The female genes will determine whether the baby will be a boy or a girl. At 6-8 weeks, the eyes and face will be formed, and the heartbeat can be seen on ultrasound. At the end of 12 weeks, all the baby's organs will be formed. Now that you are pregnant, you will want to do everything you can to have a healthy baby. Two of the most important things are to get good prenatal care and to follow your health care provider's instructions. Prenatal care is all the medical care you receive before the baby's birth. This care will help prevent, find, and treat any problems during the pregnancy and childbirth. Body changes during your first trimester Your body goes through many changes during pregnancy. The changes vary from woman to woman.  You may gain or lose a couple of pounds at first.  You may feel sick to your stomach (nauseous) and you may throw up (vomit). If the vomiting is uncontrollable, call your health care provider.  You may tire easily.  You may develop headaches that can be relieved by medicines. All medicines should be approved by your health care provider.  You may urinate more often. Painful urination may mean you have a bladder infection.  You may develop heartburn as a result of your pregnancy.  You may develop constipation because certain hormones are causing the muscles that push stool through your intestines to slow down.  You may develop hemorrhoids or swollen veins (varicose veins).  Your breasts may begin to grow larger and become tender. Your nipples may stick out more, and the tissue that surrounds them (areola) may become darker.  Your gums may bleed and may be  sensitive to brushing and flossing.  Dark spots or blotches (chloasma, mask of pregnancy) may develop on your face. This will likely fade after the baby is born.  Your menstrual periods will stop.  You may have a loss of appetite.  You may develop cravings for certain kinds of food.  You may have changes in your emotions from day to day, such as being excited to be pregnant or being concerned that something may go wrong with the pregnancy and baby.  You may have more vivid and strange dreams.  You may have changes in your hair. These can include thickening of your hair, rapid growth, and changes in texture. Some women also have hair loss during or after pregnancy, or hair that feels dry or thin. Your hair will most likely return to normal after your baby is born.  What to expect at prenatal visits During a routine prenatal visit:  You will be weighed to make sure you and the baby are growing normally.  Your blood pressure will be taken.  Your abdomen will be measured to track your baby's growth.  The fetal heartbeat will be listened to between weeks 10 and 14 of your pregnancy.  Test results from any previous visits will be discussed.  Your health care provider may ask you:  How you are feeling.  If you are feeling the baby move.  If you have had any abnormal symptoms, such as leaking fluid, bleeding, severe headaches,   or abdominal cramping.  If you are using any tobacco products, including cigarettes, chewing tobacco, and electronic cigarettes.  If you have any questions.  Other tests that may be performed during your first trimester include:  Blood tests to find your blood type and to check for the presence of any previous infections. The tests will also be used to check for low iron levels (anemia) and protein on red blood cells (Rh antibodies). Depending on your risk factors, or if you previously had diabetes during pregnancy, you may have tests to check for high blood  sugar that affects pregnant women (gestational diabetes).  Urine tests to check for infections, diabetes, or protein in the urine.  An ultrasound to confirm the proper growth and development of the baby.  Fetal screens for spinal cord problems (spina bifida) and Down syndrome.  HIV (human immunodeficiency virus) testing. Routine prenatal testing includes screening for HIV, unless you choose not to have this test.  You may need other tests to make sure you and the baby are doing well.  Follow these instructions at home: Medicines  Follow your health care provider's instructions regarding medicine use. Specific medicines may be either safe or unsafe to take during pregnancy.  Take a prenatal vitamin that contains at least 600 micrograms (mcg) of folic acid.  If you develop constipation, try taking a stool softener if your health care provider approves. Eating and drinking  Eat a balanced diet that includes fresh fruits and vegetables, whole grains, good sources of protein such as meat, eggs, or tofu, and low-fat dairy. Your health care provider will help you determine the amount of weight gain that is right for you.  Avoid raw meat and uncooked cheese. These carry germs that can cause birth defects in the baby.  Eating four or five small meals rather than three large meals a day may help relieve nausea and vomiting. If you start to feel nauseous, eating a few soda crackers can be helpful. Drinking liquids between meals, instead of during meals, also seems to help ease nausea and vomiting.  Limit foods that are high in fat and processed sugars, such as fried and sweet foods.  To prevent constipation: ? Eat foods that are high in fiber, such as fresh fruits and vegetables, whole grains, and beans. ? Drink enough fluid to keep your urine clear or pale yellow. Activity  Exercise only as directed by your health care provider. Most women can continue their usual exercise routine during  pregnancy. Try to exercise for 30 minutes at least 5 days a week. Exercising will help you: ? Control your weight. ? Stay in shape. ? Be prepared for labor and delivery.  Experiencing pain or cramping in the lower abdomen or lower back is a good sign that you should stop exercising. Check with your health care provider before continuing with normal exercises.  Try to avoid standing for long periods of time. Move your legs often if you must stand in one place for a long time.  Avoid heavy lifting.  Wear low-heeled shoes and practice good posture.  You may continue to have sex unless your health care provider tells you not to. Relieving pain and discomfort  Wear a good support bra to relieve breast tenderness.  Take warm sitz baths to soothe any pain or discomfort caused by hemorrhoids. Use hemorrhoid cream if your health care provider approves.  Rest with your legs elevated if you have leg cramps or low back pain.  If you develop   varicose veins in your legs, wear support hose. Elevate your feet for 15 minutes, 3-4 times a day. Limit salt in your diet. Prenatal care  Schedule your prenatal visits by the twelfth week of pregnancy. They are usually scheduled monthly at first, then more often in the last 2 months before delivery.  Write down your questions. Take them to your prenatal visits.  Keep all your prenatal visits as told by your health care provider. This is important. Safety  Wear your seat belt at all times when driving.  Make a list of emergency phone numbers, including numbers for family, friends, the hospital, and police and fire departments. General instructions  Ask your health care provider for a referral to a local prenatal education class. Begin classes no later than the beginning of month 6 of your pregnancy.  Ask for help if you have counseling or nutritional needs during pregnancy. Your health care provider can offer advice or refer you to specialists for help  with various needs.  Do not use hot tubs, steam rooms, or saunas.  Do not douche or use tampons or scented sanitary pads.  Do not cross your legs for long periods of time.  Avoid cat litter boxes and soil used by cats. These carry germs that can cause birth defects in the baby and possibly loss of the fetus by miscarriage or stillbirth.  Avoid all smoking, herbs, alcohol, and medicines not prescribed by your health care provider. Chemicals in these products affect the formation and growth of the baby.  Do not use any products that contain nicotine or tobacco, such as cigarettes and e-cigarettes. If you need help quitting, ask your health care provider. You may receive counseling support and other resources to help you quit.  Schedule a dentist appointment. At home, brush your teeth with a soft toothbrush and be gentle when you floss. Contact a health care provider if:  You have dizziness.  You have mild pelvic cramps, pelvic pressure, or nagging pain in the abdominal area.  You have persistent nausea, vomiting, or diarrhea.  You have a bad smelling vaginal discharge.  You have pain when you urinate.  You notice increased swelling in your face, hands, legs, or ankles.  You are exposed to fifth disease or chickenpox.  You are exposed to German measles (rubella) and have never had it. Get help right away if:  You have a fever.  You are leaking fluid from your vagina.  You have spotting or bleeding from your vagina.  You have severe abdominal cramping or pain.  You have rapid weight gain or loss.  You vomit blood or material that looks like coffee grounds.  You develop a severe headache.  You have shortness of breath.  You have any kind of trauma, such as from a fall or a car accident. Summary  The first trimester of pregnancy is from week 1 until the end of week 13 (months 1 through 3).  Your body goes through many changes during pregnancy. The changes vary from  woman to woman.  You will have routine prenatal visits. During those visits, your health care provider will examine you, discuss any test results you may have, and talk with you about how you are feeling. This information is not intended to replace advice given to you by your health care provider. Make sure you discuss any questions you have with your health care provider. Document Released: 03/31/2001 Document Revised: 03/18/2016 Document Reviewed: 03/18/2016 Elsevier Interactive Patient Education  2017 [employer]vier   Inc.  

## 2016-09-15 NOTE — Progress Notes (Signed)
Subjective:  Erica Kelly is a 31 y.o. G2P0010 at 7120w0d being seen today for first OB visit. She has no complaints today. H/O Bipolar/Anxiety, controlled with Latuda and followed by Mental Health clinic. H/O LEEP x 2 in the past for abnormal pap smears.  LMP confirmed by first trimester U/S.   She is currently monitored for the following issues for this high-risk pregnancy and has Supervision of normal pregnancy, antepartum; History of loop electrosurgical excision procedure (LEEP) of cervix affecting pregnancy; and Bipolar 1 disorder (HCC) on her problem list.  Patient reports no complaints.  Contractions: Not present. Vag. Bleeding: None.  Movement: Absent. Denies leaking of fluid.   The following portions of the patient's history were reviewed and updated as appropriate: allergies, current medications, past family history, past medical history, past social history, past surgical history and problem list. Problem list updated.  Objective:   Vitals:   09/15/16 1455  BP: 129/76  Pulse: 94  Weight: 117 lb (53.1 kg)    Fetal Status:     Movement: Absent     General:  Alert, oriented and cooperative. Patient is in no acute distress.  Skin: Skin is warm and dry. No rash noted.   Cardiovascular: Normal heart rate noted  Respiratory: Normal respiratory effort, no problems with respiration noted  Abdomen: Soft, gravid, appropriate for gestational age. Pain/Pressure: Present     Pelvic:  Cervical exam performed        Extremities: Normal range of motion.  Edema: None  Mental Status: Flat affect.  Breast sym supple no masses, nipple d/c or adenopathy Urinalysis:      Assessment and Plan:  Pregnancy: G2P0010 at 7020w0d  1. Encounter for supervision of normal pregnancy, antepartum, unspecified gravidity Prenatal care and labs reviewed with pt.  Continue with PNV  2. History of loop electrosurgical excision procedure (LEEP) of cervix affecting pregnancy in first trimester Will check  CL  3. Bipolar 1 disorder (HCC) Stable Continue Latuda per Mental Health clinic  Preterm labor symptoms and general obstetric precautions including but not limited to vaginal bleeding, contractions, leaking of fluid and fetal movement were reviewed in detail with the patient. Please refer to After Visit Summary for other counseling recommendations.  No Follow-up on file.   Hermina StaggersErvin, [name], MD

## 2016-09-15 NOTE — Assessment & Plan Note (Signed)
LEEP x 2 in 2005 and 2013

## 2016-09-17 LAB — URINE CULTURE, OB REFLEX

## 2016-09-17 LAB — CYTOLOGY - PAP
BACTERIAL VAGINITIS: NEGATIVE
CANDIDA VAGINITIS: NEGATIVE
Chlamydia: NEGATIVE
DIAGNOSIS: NEGATIVE
HPV: NOT DETECTED
Neisseria Gonorrhea: NEGATIVE
TRICH (WINDOWPATH): NEGATIVE

## 2016-09-17 LAB — CULTURE, OB URINE

## 2016-09-21 LAB — OBSTETRIC PANEL, INCLUDING HIV
ANTIBODY SCREEN: NEGATIVE
BASOS ABS: 0 10*3/uL (ref 0.0–0.2)
Basos: 0 %
EOS (ABSOLUTE): 0.1 10*3/uL (ref 0.0–0.4)
Eos: 1 %
HIV Screen 4th Generation wRfx: NONREACTIVE
Hematocrit: 40.6 % (ref 34.0–46.6)
Hemoglobin: 13.4 g/dL (ref 11.1–15.9)
Hepatitis B Surface Ag: NEGATIVE
IMMATURE GRANS (ABS): 0.1 10*3/uL (ref 0.0–0.1)
IMMATURE GRANULOCYTES: 1 %
LYMPHS: 31 %
Lymphocytes Absolute: 3.8 10*3/uL — ABNORMAL HIGH (ref 0.7–3.1)
MCH: 29.1 pg (ref 26.6–33.0)
MCHC: 33 g/dL (ref 31.5–35.7)
MCV: 88 fL (ref 79–97)
MONOS ABS: 0.9 10*3/uL (ref 0.1–0.9)
Monocytes: 8 %
NEUTROS PCT: 59 %
Neutrophils Absolute: 7.2 10*3/uL — ABNORMAL HIGH (ref 1.4–7.0)
PLATELETS: 255 10*3/uL (ref 150–379)
RBC: 4.61 x10E6/uL (ref 3.77–5.28)
RDW: 14.8 % (ref 12.3–15.4)
RPR Ser Ql: NONREACTIVE
Rh Factor: POSITIVE
Rubella Antibodies, IGG: 1.26 index (ref >0.99)
WBC: 12.1 10*3/uL — ABNORMAL HIGH (ref 3.4–10.8)

## 2016-09-21 LAB — MATERNIT21 PLUS CORE+SCA
CHROMOSOME 13: NEGATIVE
CHROMOSOME 21: NEGATIVE
Chromosome 18: NEGATIVE
Y Chromosome: NOT DETECTED

## 2016-09-21 LAB — HEMOGLOBINOPATHY EVALUATION
HEMOGLOBIN F QUANTITATION: 0 % (ref 0.0–2.0)
HGB C: 0 %
HGB S: 0 %
HGB VARIANT: 0 %
Hemoglobin A2 Quantitation: 2.3 % (ref 1.8–3.2)
Hgb A: 97.7 % (ref 96.4–98.8)

## 2016-09-21 LAB — CYSTIC FIBROSIS MUTATION 97: GENE DIS ANAL CARRIER INTERP BLD/T-IMP: NOT DETECTED

## 2016-09-21 LAB — VARICELLA ZOSTER ANTIBODY, IGG: Varicella zoster IgG: 1207 index (ref >165)

## 2016-09-25 ENCOUNTER — Ambulatory Visit (INDEPENDENT_AMBULATORY_CARE_PROVIDER_SITE_OTHER): Payer: Medicaid Other | Admitting: Family Medicine

## 2016-09-25 ENCOUNTER — Encounter: Payer: Self-pay | Admitting: Family Medicine

## 2016-09-25 VITALS — BP 126/74 | HR 77 | Temp 98.1°F | Resp 14 | Ht 59.0 in | Wt 118.0 lb

## 2016-09-25 DIAGNOSIS — Z7689 Persons encountering health services in other specified circumstances: Secondary | ICD-10-CM

## 2016-09-25 DIAGNOSIS — R0981 Nasal congestion: Secondary | ICD-10-CM | POA: Diagnosis not present

## 2016-09-25 DIAGNOSIS — Z23 Encounter for immunization: Secondary | ICD-10-CM

## 2016-09-25 MED ORDER — IPRATROPIUM BROMIDE 0.03 % NA SOLN: 2.0000 | Freq: Every day | NASAL | 0 refills | Status: DC

## 2016-09-25 MED ORDER — CETIRIZINE HCL 10 MG PO TABS: 10.0000 mg | ORAL_TABLET | Freq: Every day | ORAL | 0 refills | Status: DC

## 2016-09-25 NOTE — Patient Instructions (Signed)
Medications sent to pharmacy on file.     Eating Plan for Pregnant Women While you are pregnant, your body will require additional nutrition to help support your growing baby. It is recommended that you consume:  150 additional calories each day during your first trimester.  300 additional calories each day during your second trimester.  300 additional calories each day during your third trimester.  Eating a healthy, well-balanced diet is very important for your health and for your baby's health. You also have a higher need for some vitamins and minerals, such as folic acid, calcium, iron, and vitamin D. What do I need to know about eating during pregnancy?  Do not try to lose weight or go on a diet during pregnancy.  Choose healthy, nutritious foods. Choose  of a sandwich with a glass of milk instead of a candy bar or a high-calorie sugar-sweetened beverage.  Limit your overall intake of foods that have "empty calories." These are foods that have little nutritional value, such as sweets, desserts, candies, sugar-sweetened beverages, and fried foods.  Eat a variety of foods, especially fruits and vegetables.  Take a prenatal vitamin to help meet the additional needs during pregnancy, specifically for folic acid, iron, calcium, and vitamin D.  Remember to stay active. Ask your health care provider for exercise recommendations that are specific to you.  Practice good food safety and cleanliness, such as washing your hands before you eat and after you prepare raw meat. This helps to prevent foodborne illnesses, such as listeriosis, that can be very dangerous for your baby. Ask your health care provider for more information about listeriosis. What does 150 extra calories look like? Healthy options for an additional 150 calories each day could be any of the following:  Plain low-fat yogurt (6-8 oz) with  cup of berries.  1 apple with 2 teaspoons of peanut butter.  Cut-up  vegetables with  cup of hummus.  Low-fat chocolate milk (8 oz or 1 cup).  1 string cheese with 1 medium orange.   of a peanut butter and jelly sandwich on whole-wheat bread (1 tsp of peanut butter).  For 300 calories, you could eat two of those healthy options each day. What is a healthy amount of weight to gain? The recommended amount of weight for you to gain is based on your pre-pregnancy BMI. If your pre-pregnancy BMI was:  Less than 18 (underweight), you should gain 28-40 lb.  18-24.9 (normal), you should gain 25-35 lb.  25-29.9 (overweight), you should gain 15-25 lb.  Greater than 30 (obese), you should gain 11-20 lb.  What if I am having twins or multiples? Generally, pregnant women who will be having twins or multiples may need to increase their daily calories by 300-600 calories each day. The recommended range for total weight gain is 25-54 lb, depending on your pre-pregnancy BMI. Talk with your health care provider for specific guidance about additional nutritional needs, weight gain, and exercise during your pregnancy. What foods can I eat? Grains Any grains. Try to choose whole grains, such as whole-wheat bread, oatmeal, or brown rice. Vegetables Any vegetables. Try to eat a variety of colors and types of vegetables to get a full range of vitamins and minerals. Remember to wash your vegetables well before eating. Fruits Any fruits. Try to eat a variety of colors and types of fruit to get a full range of vitamins and minerals. Remember to wash your fruits well before eating. Meats and Other Protein Sources Lean meats,  including chicken, [place], fish, and lean cuts of beef, veal, or pork. Make sure that all meats are cooked to "well done." Tofu. Tempeh. Beans. Eggs. Peanut butter and other nut butters. Seafood, such as shrimp, crab, and lobster. If you choose fish, select types that are higher in omega-3 fatty acids, including salmon, herring, mussels, trout, sardines, and  pollock. Make sure that all meats are cooked to food-safe temperatures. Dairy Pasteurized milk and milk alternatives. Pasteurized yogurt and pasteurized cheese. Cottage cheese. Sour cream. Beverages Water. Juices that contain 100% fruit juice or vegetable juice. Caffeine-free teas and decaffeinated coffee. Drinks that contain caffeine are okay to drink, but it is better to avoid caffeine. Keep your total caffeine intake to less than 200 mg each day (12 oz of coffee, tea, or soda) or as directed by your health care provider. Condiments Any pasteurized condiments. Sweets and Desserts Any sweets and desserts. Fats and Oils Any fats and oils. The items listed above may not be a complete list of recommended foods or beverages. Contact your dietitian for more options. What foods are not recommended? Vegetables Unpasteurized (raw) vegetable juices. Fruits Unpasteurized (raw) fruit juices. Meats and Other Protein Sources Cured meats that have nitrates, such as bacon, salami, and hotdogs. Luncheon meats, bologna, or other deli meats (unless they are reheated until they are steaming hot). Refrigerated pate, meat spreads from a meat counter, smoked seafood that is found in the refrigerated section of a store. Raw fish, such as sushi or sashimi. High mercury content fish, such as tilefish, shark, swordfish, and king mackerel. Raw meats, such as tuna or beef tartare. Undercooked meats and poultry. Make sure that all meats are cooked to food-safe temperatures. Dairy Unpasteurized (raw) milk and any foods that have raw milk in them. Soft cheeses, such as feta, queso blanco, queso fresco, Brie, Camembert cheeses, blue-veined cheeses, and Panela cheese (unless it is made with pasteurized milk, which must be stated on the label). Beverages Alcohol. Sugar-sweetened beverages, such as sodas, teas, or energy drinks. Condiments Homemade fermented foods and drinks, such as pickles, sauerkraut, or kombucha drinks.  (Store-bought pasteurized versions of these are okay.) Other Salads that are made in the store, such as ham salad, chicken salad, egg salad, tuna salad, and seafood salad. The items listed above may not be a complete list of foods and beverages to avoid. Contact your dietitian for more information. This information is not intended to replace advice given to you by your health care provider. Make sure you discuss any questions you have with your health care provider. Document Released: 01/19/2014 Document Revised: 09/12/2015 Document Reviewed: 09/19/2013 Elsevier Interactive Patient Education  [employer].

## 2016-09-25 NOTE — Progress Notes (Signed)
Patient ID: Erica Kelly, female    DOB: 11/09/1985, 31 y.o.   MRN: [id number]  PCP: Bing NeighborsHarris, Aristides Luckey S, FNP  Chief Complaint  Patient presents with  . Establish Care    Subjective:  HPI Erica Kelly is a 31 y.o. female presents to establish care. Medical history significant for bipolar disorder with anxiety and depression, abnormal PAP, LEEP x 2. [facility]. She is currently around [redacted] weeks  pregnant. She is experiencing decreased appetite, nausea, and urinary frequency.  She also reports worsening nasal congestion over several weeks but hasn't taken any medication as she  was uncertain of what to take. For mental health services, she is [facility] and  is prescribed Latuda. She is concerned regarding the effects of her antipsychotic medication. Denies any thoughts of suicide or harming others.  Social History   Social History  . Marital status: Single    Spouse name: N/A  . Number of children: N/A  . Years of education: N/A   Occupational History  . Not on file.   Social History Main Topics  . Smoking status: Current Every Day Smoker    Types: Cigarettes  . Smokeless tobacco: Never Used  . Alcohol use No  . Drug use: No  . Sexual activity: Yes    Partners: Male    Birth control/ protection: None   Other Topics Concern  . Not on file   Social History Narrative  . No narrative on file    Family History  Problem Relation Age of Onset  . Diabetes Mother   . Hypertension Mother   . Diabetes Father   . Hypertension Father    Review of Systems See HPI Patient Active Problem List   Diagnosis Date Noted  . History of loop electrosurgical excision procedure (LEEP) of cervix affecting pregnancy 09/15/2016  . Bipolar 1 disorder (HCC) 09/15/2016  . Supervision of normal pregnancy, antepartum 09/11/2016    Allergies  Allergen Reactions  . Bactrim  [Sulfamethoxazole-Trimethoprim] Shortness Of Breath and Swelling    Throat swells shut    Prior to Admission medications   Medication Sig Start Date End Date Taking? Authorizing Provider  lurasidone (LATUDA) 40 MG TABS tablet Take 40 mg by mouth daily with breakfast.   Yes [provider]  Prenatal Vit-Fe Fumarate-FA (PRENATAL MULTIVITAMIN) TABS tablet Take 1 tablet by mouth daily at 12 noon.   Yes [provider]    Past Medical, Surgical Family and Social History reviewed and updated.    Objective:   Today's Vitals   09/25/16 0911  BP: 126/74  Pulse: 77  Resp: 14  Temp: 98.1 F (36.7 C)  TempSrc: Oral  SpO2: 100%  Weight: 118 lb (53.5 kg)  Height: 4\' 11"  (1.499 m)    Wt Readings from Last 3 Encounters:  09/25/16 118 lb (53.5 kg)  09/15/16 117 lb (53.1 kg)  08/14/16 111 lb (50.3 kg)   Physical Exam  Constitutional: She is oriented to person, place, and time. She appears well-developed and well-nourished.  HENT:  Head: Normocephalic and atraumatic.  Eyes: Conjunctivae and EOM are normal. Pupils are equal, round, and reactive to light.  Neck: Normal range of motion. Neck supple.  Cardiovascular: Normal rate, regular rhythm, normal heart sounds and intact distal pulses.   Pulmonary/Chest: Effort normal and breath sounds normal.  Abdominal: Soft. Bowel sounds are normal. She exhibits no distension. There is no tenderness.  Musculoskeletal: Normal range of motion.  Neurological: She is alert and oriented to person, place, and time.  Skin: Skin is warm and dry.  Psychiatric: She has a normal mood and affect. Her behavior is normal. Judgment and thought content normal.    Assessment & Plan:  1. Encounter to establish care -Pregnancy Anticipatory Guidance provided.  2. Nasal Congestion  -Cetirizine 10 mg at bedtime, daily  -  Ipratropium (Atrovent) 2 sprays, daily   3. Need for diphtheria-tetanus-pertussis (Tdap) vaccine - Tdap vaccine greater than  or equal to 7yo IM  -Advised patient to discuss with OB and psychiatrist regarding discontinuing Latuda vs. discontinue during pregnancy.  RTC: 3 months influenza vaccine.  Godfrey Pick. Tiburcio Pea, MSN, FNP-C [facility]  [address] [place], [place] [address] [phone]

## 2016-10-13 ENCOUNTER — Encounter: Payer: Medicaid Other | Admitting: Obstetrics and Gynecology

## 2016-10-14 ENCOUNTER — Ambulatory Visit (INDEPENDENT_AMBULATORY_CARE_PROVIDER_SITE_OTHER): Payer: Medicaid Other | Admitting: Obstetrics and Gynecology

## 2016-10-14 VITALS — BP 115/78 | HR 93 | Wt 121.2 lb

## 2016-10-14 DIAGNOSIS — G44209 Tension-type headache, unspecified, not intractable: Secondary | ICD-10-CM

## 2016-10-14 DIAGNOSIS — O3442 Maternal care for other abnormalities of cervix, second trimester: Secondary | ICD-10-CM | POA: Diagnosis not present

## 2016-10-14 DIAGNOSIS — F319 Bipolar disorder, unspecified: Secondary | ICD-10-CM | POA: Diagnosis not present

## 2016-10-14 DIAGNOSIS — R519 Headache, unspecified: Secondary | ICD-10-CM | POA: Insufficient documentation

## 2016-10-14 DIAGNOSIS — O0992 Supervision of high risk pregnancy, unspecified, second trimester: Secondary | ICD-10-CM

## 2016-10-14 DIAGNOSIS — R51 Headache: Secondary | ICD-10-CM

## 2016-10-14 DIAGNOSIS — Z3490 Encounter for supervision of normal pregnancy, unspecified, unspecified trimester: Secondary | ICD-10-CM

## 2016-10-14 DIAGNOSIS — Z9889 Other specified postprocedural states: Principal | ICD-10-CM

## 2016-10-14 MED ORDER — BUTALBITAL-APAP-CAFFEINE 50-325-40 MG PO CAPS: 1.0000 | ORAL_CAPSULE | Freq: Four times a day (QID) | ORAL | 0 refills | Status: DC | PRN

## 2016-10-14 NOTE — Progress Notes (Signed)
Patient is in the office, denies pressure/contractions, complains of frequent headaches.

## 2016-10-14 NOTE — Progress Notes (Signed)
Subjective:  Erica GrammesBrittany Kelly is a 31 y.o. G2P0010 at 339w1d being seen today for ongoing prenatal care.  She is currently monitored for the following issues for this high-risk pregnancy and has Supervision of normal pregnancy, antepartum; History of loop electrosurgical excision procedure (LEEP) of cervix affecting pregnancy; Bipolar 1 disorder (HCC); and Headache on her problem list.  Patient reports tension like headache x 1 week. Tylenol helps but HA returns. No visual problems..  Contractions: Not present. Vag. Bleeding: None.   . Denies leaking of fluid.   The following portions of the patient's history were reviewed and updated as appropriate: allergies, current medications, past family history, past medical history, past social history, past surgical history and problem list. Problem list updated.  Objective:   Vitals:   10/14/16 1324  BP: 115/78  Pulse: 93  Weight: 121 lb 3.2 oz (55 kg)    Fetal Status: Fetal Heart Rate (bpm): 145         General:  Alert, oriented and cooperative. Patient is in no acute distress.  Skin: Skin is warm and dry. No rash noted.   Cardiovascular: Normal heart rate noted  Respiratory: Normal respiratory effort, no problems with respiration noted  Abdomen: Soft, gravid, appropriate for gestational age. Pain/Pressure: Absent     Pelvic:  Cervical exam deferred        Extremities: Normal range of motion.  Edema: None  Mental Status: Normal mood and affect. Normal behavior. Normal judgment and thought content.   Urinalysis:      Assessment and Plan:  Pregnancy: G2P0010 at 5539w1d  1. Encounter for supervision of normal pregnancy, antepartum, unspecified gravidity Stable - US MFM OB COMP + 14 WK; Future  2. History of loop electrosurgical excision procedure (LEEP) of cervix affecting pregnancy in second trimester U/S with CL ordered  3. Bipolar 1 disorder (HCC) Stable  Continue with current medicine as per Mental Health Clinic  4. Tension-type  headache, not intractable, unspecified chronicity pattern  - Butalbital-APAP-Caffeine 50-325-40 MG capsule; Take 1-2 capsules by mouth every 6 (six) hours as needed for headache.  Dispense: 30 capsule; Refill: 0  Preterm labor symptoms and general obstetric precautions including but not limited to vaginal bleeding, contractions, leaking of fluid and fetal movement were reviewed in detail with the patient. Please refer to After Visit Summary for other counseling recommendations.  Return in about 4 weeks (around 11/11/2016) for OB visit.   Hermina StaggersErvin, Allesandra Huebsch L, MD

## 2016-10-16 ENCOUNTER — Ambulatory Visit: Payer: Medicaid Other | Admitting: Family Medicine

## 2016-11-03 ENCOUNTER — Ambulatory Visit (HOSPITAL_COMMUNITY)
Admission: RE | Admit: 2016-11-03 | Discharge: 2016-11-03 | Disposition: A | Discharge status: Home / Self Care | Payer: Medicaid Other | Source: Ambulatory Visit | Attending: Obstetrics and Gynecology | Admitting: Obstetrics and Gynecology

## 2016-11-03 ENCOUNTER — Other Ambulatory Visit: Payer: Self-pay | Admitting: Obstetrics and Gynecology

## 2016-11-03 DIAGNOSIS — O3441 Maternal care for other abnormalities of cervix, first trimester: Secondary | ICD-10-CM

## 2016-11-03 DIAGNOSIS — O3442 Maternal care for other abnormalities of cervix, second trimester: Secondary | ICD-10-CM | POA: Insufficient documentation

## 2016-11-03 DIAGNOSIS — Z9889 Other specified postprocedural states: Secondary | ICD-10-CM

## 2016-11-03 DIAGNOSIS — Z3A19 19 weeks gestation of pregnancy: Secondary | ICD-10-CM | POA: Diagnosis not present

## 2016-11-03 DIAGNOSIS — Z3490 Encounter for supervision of normal pregnancy, unspecified, unspecified trimester: Secondary | ICD-10-CM

## 2016-11-03 DIAGNOSIS — Z369 Encounter for antenatal screening, unspecified: Secondary | ICD-10-CM

## 2016-11-03 DIAGNOSIS — Z363 Encounter for antenatal screening for malformations: Secondary | ICD-10-CM | POA: Insufficient documentation

## 2016-11-05 ENCOUNTER — Other Ambulatory Visit: Payer: Self-pay | Admitting: *Deleted

## 2016-11-05 DIAGNOSIS — Z0489 Encounter for examination and observation for other specified reasons: Secondary | ICD-10-CM

## 2016-11-05 DIAGNOSIS — IMO0002 Reserved for concepts with insufficient information to code with codable children: Secondary | ICD-10-CM

## 2016-11-05 NOTE — Progress Notes (Signed)
See lab/ US note

## 2016-11-06 ENCOUNTER — Other Ambulatory Visit: Payer: Self-pay | Admitting: *Deleted

## 2016-11-06 DIAGNOSIS — O26879 Cervical shortening, unspecified trimester: Secondary | ICD-10-CM

## 2016-11-11 ENCOUNTER — Encounter: Payer: Medicaid Other | Admitting: Certified Nurse Midwife

## 2016-11-11 ENCOUNTER — Ambulatory Visit (INDEPENDENT_AMBULATORY_CARE_PROVIDER_SITE_OTHER): Payer: Medicaid Other | Admitting: Obstetrics and Gynecology

## 2016-11-11 VITALS — BP 140/86 | HR 84 | Wt 136.5 lb

## 2016-11-11 DIAGNOSIS — F319 Bipolar disorder, unspecified: Secondary | ICD-10-CM

## 2016-11-11 DIAGNOSIS — IMO0002 Reserved for concepts with insufficient information to code with codable children: Secondary | ICD-10-CM | POA: Insufficient documentation

## 2016-11-11 DIAGNOSIS — G44209 Tension-type headache, unspecified, not intractable: Secondary | ICD-10-CM

## 2016-11-11 DIAGNOSIS — Z9889 Other specified postprocedural states: Secondary | ICD-10-CM

## 2016-11-11 DIAGNOSIS — O3442 Maternal care for other abnormalities of cervix, second trimester: Secondary | ICD-10-CM

## 2016-11-11 DIAGNOSIS — O358XX0 Maternal care for other (suspected) fetal abnormality and damage, not applicable or unspecified: Secondary | ICD-10-CM

## 2016-11-11 DIAGNOSIS — Z3490 Encounter for supervision of normal pregnancy, unspecified, unspecified trimester: Secondary | ICD-10-CM

## 2016-11-11 DIAGNOSIS — IMO0001 Reserved for inherently not codable concepts without codable children: Secondary | ICD-10-CM

## 2016-11-11 MED ORDER — BUTALBITAL-APAP-CAFFEINE 50-325-40 MG PO CAPS
1.0000 | ORAL_CAPSULE | Freq: Four times a day (QID) | ORAL | 0 refills | Status: DC | PRN
Start: 2016-11-11 — End: 2017-02-12

## 2016-11-11 NOTE — Progress Notes (Signed)
Pt requests PNV rx and Butalbital refill.

## 2016-11-11 NOTE — Addendum Note (Signed)
Addended by: Dalphine HandingGARDNER, Alin Chavira L on: 11/11/2016 09:36 AM   Modules accepted: Orders

## 2016-11-11 NOTE — Progress Notes (Signed)
Subjective:  Erica Kelly is a 31 y.o. G2P0010 at 573w1d being seen today for ongoing prenatal care.  She is currently monitored for the following issues for this high-risk pregnancy and has Supervision of normal pregnancy, antepartum; History of loop electrosurgical excision procedure (LEEP) of cervix affecting pregnancy; Bipolar 1 disorder (HCC); Headache; and Fetal cardiac echogenic focus on her problem list.  Patient reports HA better with meds but needs refill.  Contractions: Not present. Vag. Bleeding: None.  Movement: Absent. Denies leaking of fluid.   The following portions of the patient's history were reviewed and updated as appropriate: allergies, current medications, past family history, past medical history, past social history, past surgical history and problem list. Problem list updated.  Objective:   Vitals:   11/11/16 0908 11/11/16 0912  BP: (!) 138/94 140/86  Pulse: 84   Weight: 136 lb 8 oz (61.9 kg)     Fetal Status: Fetal Heart Rate (bpm): 148   Movement: Absent     General:  Alert, oriented and cooperative. Patient is in no acute distress.  Skin: Skin is warm and dry. No rash noted.   Cardiovascular: Normal heart rate noted  Respiratory: Normal respiratory effort, no problems with respiration noted  Abdomen: Soft, gravid, appropriate for gestational age. Pain/Pressure: Absent     Pelvic:  Cervical exam deferred        Extremities: Normal range of motion.  Edema: None  Mental Status: Normal mood and affect. Normal behavior. Normal judgment and thought content.   Urinalysis:      Assessment and Plan:  Pregnancy: G2P0010 at 6373w1d  1. Encounter for supervision of normal pregnancy, antepartum, unspecified gravidity Stable AFP today F/U U/S to complete anatomy scheduled 2. Bipolar 1 disorder (HCC) Stable on Lutuda Followed by mental health clinic  3. History of loop electrosurgical excision procedure (LEEP) of cervix affecting pregnancy in second  trimester CL 2.4 cm on U/s no funneling Repeat CL next week  4. Fetal cardiac echogenic focus, single or unspecified fetus No other U/S markers noted  5. Tension-type headache, not intractable, unspecified chronicity pattern  - Butalbital-APAP-Caffeine 50-325-40 MG capsule; Take 1-2 capsules by mouth every 6 (six) hours as needed for headache.  Dispense: 30 capsule; Refill: 0  Preterm labor symptoms and general obstetric precautions including but not limited to vaginal bleeding, contractions, leaking of fluid and fetal movement were reviewed in detail with the patient. Please refer to After Visit Summary for other counseling recommendations.  Return in about 4 weeks (around 12/09/2016) for OB visit.   Hermina StaggersErvin, Tylea Hise L, MD

## 2016-11-12 ENCOUNTER — Encounter: Payer: Self-pay | Admitting: Family Medicine

## 2016-11-12 ENCOUNTER — Ambulatory Visit (INDEPENDENT_AMBULATORY_CARE_PROVIDER_SITE_OTHER): Payer: Medicaid Other | Admitting: Family Medicine

## 2016-11-12 VITALS — BP 124/68 | HR 87 | Temp 98.2°F | Resp 16 | Ht 59.0 in | Wt 131.8 lb

## 2016-11-12 DIAGNOSIS — R0981 Nasal congestion: Secondary | ICD-10-CM | POA: Diagnosis not present

## 2016-11-12 DIAGNOSIS — Z720 Tobacco use: Secondary | ICD-10-CM | POA: Diagnosis not present

## 2016-11-12 DIAGNOSIS — Z3A20 20 weeks gestation of pregnancy: Secondary | ICD-10-CM | POA: Diagnosis not present

## 2016-11-12 DIAGNOSIS — Z716 Tobacco abuse counseling: Secondary | ICD-10-CM | POA: Diagnosis not present

## 2016-11-12 MED ORDER — CETIRIZINE HCL 10 MG PO TABS: 10.0000 mg | ORAL_TABLET | Freq: Every day | ORAL | 3 refills | Status: AC

## 2016-11-12 MED ORDER — BUPROPION HCL ER (SR) 150 MG PO TB12: 150.0000 mg | ORAL_TABLET | Freq: Two times a day (BID) | ORAL | 2 refills | Status: DC

## 2016-11-12 NOTE — Progress Notes (Signed)
Patient ID: Erica Kelly, female    DOB: 01/23/1986, 31 y.o.   MRN: [id number]  PCP: Bing NeighborsHarris, Cecillia Menees S, FNP  Chief Complaint  Patient presents with  . wants to stop smoking  . Hypertension    Subjective:  HPI Erica Kelly is a 31 y.o. female presents for evaluation smoking cessation and reevaluation of elevated blood pressure. Erica Kelly is currently [redacted] week pregnant and presents today to obtain counseling on smoking cessation. She has been unsuccessful in achieving smoking cessation independently. She continues to smoke on average less than 5 cigarettes per day in spite of current pregnancy. She understands the risks continued smoking can pose to her unborn child. Erica Kelly also suffers from bipolar and has opted to continue Erica Kelly throughout her pregnancy. Patient reports that during a recent prenatal visit her blood pressure was elevated 140/86. She has no prior diagnosis of hypertension. Erica Kelly is also experiencing continued nasal congestion, however achieved improved of symptoms with taking cetrizine.   Social History   Social History  . Marital status: Single    Spouse name: N/A  . Number of children: N/A  . Years of education: N/A   Occupational History  . Not on file.   Social History Main Topics  . Smoking status: Current Every Day Smoker    Types: Cigarettes  . Smokeless tobacco: Never Used  . Alcohol use No  . Drug use: No  . Sexual activity: Yes    Partners: Male    Birth control/ protection: None   Other Topics Concern  . Not on file   Social History Narrative  . No narrative on file    Family History  Problem Relation Age of Onset  . Diabetes Mother   . Hypertension Mother   . Diabetes Father   . Hypertension Father    Review of Systems See HPI Patient Active Problem List   Diagnosis Date Noted  . Fetal cardiac echogenic focus 11/11/2016  . Headache 10/14/2016  . History of loop electrosurgical excision procedure (LEEP) of cervix  affecting pregnancy 09/15/2016  . Bipolar 1 disorder (HCC) 09/15/2016  . Supervision of normal pregnancy, antepartum 09/11/2016    Allergies  Allergen Reactions  . Bactrim [Sulfamethoxazole-Trimethoprim] Shortness Of Breath and Swelling    Throat swells shut    Prior to Admission medications   Medication Sig Start Date End Date Taking? Authorizing Provider  Butalbital-APAP-Caffeine 50-325-40 MG capsule Take 1-2 capsules by mouth every 6 (six) hours as needed for headache. 11/11/16  Yes Hermina StaggersErvin, Michael L, MD  cetirizine (ZYRTEC) 10 MG tablet Take 1 tablet (10 mg total) by mouth daily. 09/25/16  Yes Bing NeighborsHarris, Scotti Kosta S, FNP  lurasidone (LATUDA) 40 MG TABS tablet Take 40 mg by mouth daily with breakfast.   Yes [provider]  Prenatal Vit-Fe Fumarate-FA (PRENATAL MULTIVITAMIN) TABS tablet Take 1 tablet by mouth daily at 12 noon.   Yes [provider]  ipratropium (ATROVENT) 0.03 % nasal spray Place 2 sprays into both nostrils daily. Patient not taking: Reported on 10/14/2016 09/25/16   Bing NeighborsHarris, Netanya Yazdani S, FNP    Past Medical, Surgical Family and Social History reviewed and updated.    Objective:   Today's Vitals   11/12/16 0956  BP: 124/68  Pulse: 87  Resp: 16  Temp: 98.2 F (36.8 C)  TempSrc: Oral  SpO2: 100%  Weight: 131 lb 12.8 oz (59.8 kg)  Height: 4\' 11"  (1.499 m)    Wt Readings from Last 3 Encounters:  11/12/16 131 lb 12.8 oz (  59.8 kg)  11/11/16 136 lb 8 oz (61.9 kg)  10/14/16 121 lb 3.2 oz (55 kg)   Physical Exam  Constitutional: She is oriented to person, place, and time. She appears well-developed and well-nourished.  HENT:  Head: Normocephalic and atraumatic.  Eyes: Pupils are equal, round, and reactive to light.  Neck: Normal range of motion. Neck supple.  Cardiovascular: Normal rate, regular rhythm, normal heart sounds and intact distal pulses.   Pulmonary/Chest: Effort normal and breath sounds normal.  Abdominal: Soft. Bowel sounds are normal.   Musculoskeletal: Normal range of motion.  Neurological: She is alert and oriented to person, place, and time.  Skin: Skin is warm and dry.  Psychiatric: She has a normal mood and affect. Her behavior is normal. Judgment and thought content normal.   Assessment & Plan:  1. Encounter for smoking cessation counseling -Will trial patient on Bupropion 150 mg Q12HR for a total of 6-12 weeks in order to achieve smoking cessation. -Will follow-up via phone in 1 week to evaluate the effectiveness of therapy.  2. Nasal congestion -Continue Cetirizine 10 mg once daily.  3. [redacted] weeks gestation of pregnancy -Continue follow-up with Dr. Nettie ElmMichael Ervin. OB  RTC: 3-4 months for evaluation of smoking or sooner if needed.   Godfrey PickKimberly S. Tiburcio PeaHarris, MSN, FNP-C [facility]  [address] [place], [place] [address] [phone]

## 2016-11-12 NOTE — Patient Instructions (Signed)
Take Bupropion (Wellbutrin) 150 mg twice daily for smoking cessation.    Steps to Quit Smoking Smoking tobacco can be bad for your health. It can also affect almost every organ in your body. Smoking puts you and people around you at risk for many serious long-lasting (chronic) diseases. Quitting smoking is hard, but it is one of the best things that you can do for your health. It is never too late to quit. What are the benefits of quitting smoking? When you quit smoking, you lower your risk for getting serious diseases and conditions. They can include:  Lung cancer or lung disease.  Heart disease.  Stroke.  Heart attack.  Not being able to have children (infertility).  Weak bones (osteoporosis) and broken bones (fractures).  If you have coughing, wheezing, and shortness of breath, those symptoms may get better when you quit. You may also get sick less often. If you are pregnant, quitting smoking can help to lower your chances of having a baby of low birth weight. What can I do to help me quit smoking? Talk with your doctor about what can help you quit smoking. Some things you can do (strategies) include:  Quitting smoking totally, instead of slowly cutting back how much you smoke over a period of time.  Going to in-person counseling. You are more likely to quit if you go to many counseling sessions.  Using resources and support systems, such as: ? Agricultural engineernline chats with a Veterinary surgeoncounselor. ? Phone quitlines. ? Automotive engineerrinted self-help materials. ? Support groups or group counseling. ? Text messaging programs. ? Mobile phone apps or applications.  Taking medicines. Some of these medicines may have nicotine in them. If you are pregnant or breastfeeding, do not take any medicines to quit smoking unless your doctor says it is okay. Talk with your doctor about counseling or other things that can help you.  Talk with your doctor about using more than one strategy at the same time, such as taking  medicines while you are also going to in-person counseling. This can help make quitting easier. What things can I do to make it easier to quit? Quitting smoking might feel very hard at first, but there is a lot that you can do to make it easier. Take these steps:  Talk to your family and friends. Ask them to support and encourage you.  Call phone quitlines, reach out to support groups, or work with a Veterinary surgeoncounselor.  Ask people who smoke to not smoke around you.  Avoid places that make you want (trigger) to smoke, such as: ? Bars. ? Parties. ? Smoke-break areas at work.  Spend time with people who do not smoke.  Lower the stress in your life. Stress can make you want to smoke. Try these things to help your stress: ? Getting regular exercise. ? Deep-breathing exercises. ? Yoga. ? Meditating. ? Doing a body scan. To do this, close your eyes, focus on one area of your body at a time from head to toe, and notice which parts of your body are tense. Try to relax the muscles in those areas.  Download or buy apps on your mobile phone or tablet that can help you stick to your quit plan. There are many free apps, such as QuitGuide from the [employer] developer(Centers for Disease Control and Prevention). You can find more support from smokefree.gov and other websites.  This information is not intended to replace advice given to you by your health care provider. Make sure you discuss  any questions you have with your health care provider. Document Released: 01/31/2009 Document Revised: 12/03/2015 Document Reviewed: 08/21/2014 Elsevier Interactive Patient Education  2018 [employer].

## 2016-11-15 LAB — AFP, SERUM, OPEN SPINA BIFIDA
AFP MoM: 0.95
AFP Value: 63.5 ng/mL
GEST. AGE ON COLLECTION DATE: 20.1 wk
Maternal Age At EDD: 33.8 yr
OSBR RISK 1 IN: 10000
TEST RESULTS AFP: NEGATIVE
WEIGHT: 136 [lb_av]

## 2016-11-19 ENCOUNTER — Ambulatory Visit (HOSPITAL_COMMUNITY)
Admission: RE | Admit: 2016-11-19 | Discharge: 2016-11-19 | Disposition: A | Discharge status: Home / Self Care | Payer: Medicaid Other | Source: Ambulatory Visit | Attending: Obstetrics and Gynecology | Admitting: Obstetrics and Gynecology

## 2016-11-19 ENCOUNTER — Ambulatory Visit (HOSPITAL_COMMUNITY): Payer: Medicaid Other

## 2016-11-19 DIAGNOSIS — Z3A21 21 weeks gestation of pregnancy: Secondary | ICD-10-CM | POA: Diagnosis not present

## 2016-11-19 DIAGNOSIS — O26879 Cervical shortening, unspecified trimester: Secondary | ICD-10-CM | POA: Diagnosis present

## 2016-11-19 DIAGNOSIS — O26872 Cervical shortening, second trimester: Secondary | ICD-10-CM | POA: Insufficient documentation

## 2016-11-19 DIAGNOSIS — Z3686 Encounter for antenatal screening for cervical length: Secondary | ICD-10-CM | POA: Insufficient documentation

## 2016-11-19 DIAGNOSIS — O3442 Maternal care for other abnormalities of cervix, second trimester: Secondary | ICD-10-CM | POA: Diagnosis not present

## 2016-11-20 ENCOUNTER — Other Ambulatory Visit: Payer: Self-pay

## 2016-11-20 DIAGNOSIS — Z3490 Encounter for supervision of normal pregnancy, unspecified, unspecified trimester: Secondary | ICD-10-CM

## 2016-11-20 DIAGNOSIS — O26879 Cervical shortening, unspecified trimester: Secondary | ICD-10-CM

## 2016-11-20 MED ORDER — PRENATAL MULTIVITAMIN CH: 1.0000 | ORAL_TABLET | Freq: Every day | ORAL | 5 refills | Status: AC

## 2016-11-20 MED ORDER — PRENATAL MULTIVITAMIN CH: 1.0000 | ORAL_TABLET | Freq: Every day | ORAL | 5 refills | Status: DC

## 2016-11-20 MED ORDER — PROGESTERONE MICRONIZED 200 MG PO CAPS: ORAL_CAPSULE | ORAL | 3 refills | Status: DC

## 2016-11-20 NOTE — Progress Notes (Unsigned)
Rx has been sent to pharmacy

## 2016-12-01 ENCOUNTER — Other Ambulatory Visit: Payer: Self-pay | Admitting: Obstetrics and Gynecology

## 2016-12-01 ENCOUNTER — Telehealth: Payer: Self-pay

## 2016-12-01 ENCOUNTER — Ambulatory Visit (HOSPITAL_COMMUNITY)
Admission: RE | Admit: 2016-12-01 | Discharge: 2016-12-01 | Disposition: A | Discharge status: Home / Self Care | Payer: Medicaid Other | Source: Ambulatory Visit | Attending: Obstetrics and Gynecology | Admitting: Obstetrics and Gynecology

## 2016-12-01 DIAGNOSIS — Z0489 Encounter for examination and observation for other specified reasons: Secondary | ICD-10-CM

## 2016-12-01 DIAGNOSIS — IMO0002 Reserved for concepts with insufficient information to code with codable children: Secondary | ICD-10-CM

## 2016-12-01 DIAGNOSIS — Z3A23 23 weeks gestation of pregnancy: Secondary | ICD-10-CM | POA: Diagnosis not present

## 2016-12-01 DIAGNOSIS — O3440 Maternal care for other abnormalities of cervix, unspecified trimester: Secondary | ICD-10-CM | POA: Diagnosis present

## 2016-12-01 DIAGNOSIS — Z362 Encounter for other antenatal screening follow-up: Secondary | ICD-10-CM | POA: Diagnosis present

## 2016-12-01 DIAGNOSIS — Z3686 Encounter for antenatal screening for cervical length: Secondary | ICD-10-CM | POA: Diagnosis not present

## 2016-12-01 NOTE — Telephone Encounter (Signed)
Radiology called because patient was unsure of how to take her progesterone. She asked that the staff call the patient to explain to her how the medication is to be taken.Called patient and explained that she should insert one capsule in the vagina every night at bedtime. Patient stated that she has been taking the tablets by mouth because the other side of the bottle says take with food. I explained to her that she can insert the remainder of the medication vaginally at bedtime. Patient stated that she feels fine. Patient verbalized understanding and had no questions.

## 2016-12-08 ENCOUNTER — Other Ambulatory Visit: Payer: Self-pay

## 2016-12-08 DIAGNOSIS — O26872 Cervical shortening, second trimester: Secondary | ICD-10-CM

## 2016-12-08 NOTE — Progress Notes (Signed)
F/U U/S ordered.

## 2016-12-09 ENCOUNTER — Ambulatory Visit (INDEPENDENT_AMBULATORY_CARE_PROVIDER_SITE_OTHER): Payer: Medicaid Other | Admitting: Obstetrics and Gynecology

## 2016-12-09 VITALS — BP 117/79 | HR 79 | Wt 138.0 lb

## 2016-12-09 DIAGNOSIS — IMO0001 Reserved for inherently not codable concepts without codable children: Secondary | ICD-10-CM

## 2016-12-09 DIAGNOSIS — O3442 Maternal care for other abnormalities of cervix, second trimester: Secondary | ICD-10-CM

## 2016-12-09 DIAGNOSIS — F319 Bipolar disorder, unspecified: Secondary | ICD-10-CM

## 2016-12-09 DIAGNOSIS — O358XX0 Maternal care for other (suspected) fetal abnormality and damage, not applicable or unspecified: Secondary | ICD-10-CM

## 2016-12-09 DIAGNOSIS — Z3480 Encounter for supervision of other normal pregnancy, unspecified trimester: Secondary | ICD-10-CM

## 2016-12-09 DIAGNOSIS — Z9889 Other specified postprocedural states: Secondary | ICD-10-CM

## 2016-12-09 DIAGNOSIS — Z3482 Encounter for supervision of other normal pregnancy, second trimester: Secondary | ICD-10-CM

## 2016-12-09 NOTE — Progress Notes (Signed)
Subjective:  Erica Kelly is a 31 y.o. G2P0010 at [redacted]w[redacted]d being seen today for ongoing prenatal care.  She is currently monitored for the following issues for this high-risk pregnancy and has Supervision of normal pregnancy, antepartum; History of loop electrosurgical excision procedure (LEEP) of cervix affecting pregnancy; Bipolar 1 disorder (HCC); Headache; and Fetal cardiac echogenic focus on her problem list.  Patient reports no complaints.  Contractions: Not present. Vag. Bleeding: None.  Movement: Present. Denies leaking of fluid.   The following portions of the patient's history were reviewed and updated as appropriate: allergies, current medications, past family history, past medical history, past social history, past surgical history and problem list. Problem list updated.  Objective:   Vitals:   12/09/16 0834  BP: 117/79  Pulse: 79  Weight: 138 lb (62.6 kg)    Fetal Status: Fetal Heart Rate (bpm): 145   Movement: Present     General:  Alert, oriented and cooperative. Patient is in no acute distress.  Skin: Skin is warm and dry. No rash noted.   Cardiovascular: Normal heart rate noted  Respiratory: Normal respiratory effort, no problems with respiration noted  Abdomen: Soft, gravid, appropriate for gestational age. Pain/Pressure: Absent     Pelvic:  Cervical exam deferred        Extremities: Normal range of motion.     Mental Status: Normal mood and affect. Normal behavior. Normal judgment and thought content.   Urinalysis:      Assessment and Plan:  Pregnancy: G2P0010 at [redacted]w[redacted]d  1. Supervision of other normal pregnancy, antepartum Stable Tried Wellbutrin for smoking cessation with PCP, pt has stopped. Smokes occ now. Encouraged to d/c Glucola at next  OB visit - [place] [facility] Transvaginal; Future  2. Bipolar 1 disorder (HCC) Stable Continue with Lutuda and f/u with [facility]  3. Fetal cardiac echogenic focus, single or unspecified fetus Stable  4. History of loop  electrosurgical excision procedure (LEEP) of cervix affecting pregnancy in second trimester CL improved with Prometrium. Had been some confusion on how to take the Prometrium but this has been corrected Pt has no questions today about how to take Repeat CL ordered for next week  Preterm labor symptoms and general obstetric precautions including but not limited to vaginal bleeding, contractions, leaking of fluid and fetal movement were reviewed in detail with the patient. Please refer to After Visit Summary for other counseling recommendations.  Return in about 4 weeks (around 01/06/2017) for OB visit.   Hermina Staggers, MD

## 2016-12-15 ENCOUNTER — Other Ambulatory Visit: Payer: Self-pay | Admitting: Obstetrics and Gynecology

## 2016-12-15 ENCOUNTER — Ambulatory Visit (HOSPITAL_COMMUNITY)
Admission: RE | Admit: 2016-12-15 | Discharge: 2016-12-15 | Disposition: A | Discharge status: Home / Self Care | Payer: Medicaid Other | Source: Ambulatory Visit | Attending: Obstetrics and Gynecology | Admitting: Obstetrics and Gynecology

## 2016-12-15 DIAGNOSIS — Z3480 Encounter for supervision of other normal pregnancy, unspecified trimester: Secondary | ICD-10-CM

## 2016-12-15 DIAGNOSIS — Z3686 Encounter for antenatal screening for cervical length: Secondary | ICD-10-CM

## 2016-12-15 DIAGNOSIS — O322XX0 Maternal care for transverse and oblique lie, not applicable or unspecified: Secondary | ICD-10-CM | POA: Insufficient documentation

## 2016-12-15 DIAGNOSIS — Z3A25 25 weeks gestation of pregnancy: Secondary | ICD-10-CM | POA: Insufficient documentation

## 2016-12-15 NOTE — Addendum Note (Signed)
Encounter addended by: Vivien Rota, RT on: 12/15/2016 10:21 AM<BR>    Actions taken: Imaging Exam ended

## 2017-01-06 ENCOUNTER — Encounter: Payer: Medicaid Other | Admitting: Obstetrics & Gynecology

## 2017-01-06 ENCOUNTER — Other Ambulatory Visit: Payer: Medicaid Other

## 2017-01-11 ENCOUNTER — Encounter: Payer: Medicaid Other | Admitting: Certified Nurse Midwife

## 2017-01-11 ENCOUNTER — Other Ambulatory Visit: Payer: Medicaid Other

## 2017-01-11 DIAGNOSIS — Z3480 Encounter for supervision of other normal pregnancy, unspecified trimester: Secondary | ICD-10-CM

## 2017-01-12 LAB — RPR: RPR: NONREACTIVE

## 2017-01-12 LAB — HIV ANTIBODY (ROUTINE TESTING W REFLEX): HIV Screen 4th Generation wRfx: NONREACTIVE

## 2017-01-12 LAB — CBC
Hematocrit: 36.8 % (ref 34.0–46.6)
Hemoglobin: 12 g/dL (ref 11.1–15.9)
MCH: 29.6 pg (ref 26.6–33.0)
MCHC: 32.6 g/dL (ref 31.5–35.7)
MCV: 91 fL (ref 79–97)
PLATELETS: 189 10*3/uL (ref 150–379)
RBC: 4.06 x10E6/uL (ref 3.77–5.28)
RDW: 14.4 % (ref 12.3–15.4)
WBC: 9.4 10*3/uL (ref 3.4–10.8)

## 2017-01-12 LAB — GLUCOSE TOLERANCE, 2 HOURS W/ 1HR
GLUCOSE, 1 HOUR: 118 mg/dL (ref 65–179)
GLUCOSE, 2 HOUR: 98 mg/dL (ref 65–152)
Glucose, Fasting: 80 mg/dL (ref 65–91)

## 2017-01-14 ENCOUNTER — Encounter: Payer: Medicaid Other | Admitting: Certified Nurse Midwife

## 2017-01-21 ENCOUNTER — Ambulatory Visit (INDEPENDENT_AMBULATORY_CARE_PROVIDER_SITE_OTHER): Payer: Medicaid Other | Admitting: Certified Nurse Midwife

## 2017-01-21 DIAGNOSIS — Z3490 Encounter for supervision of normal pregnancy, unspecified, unspecified trimester: Secondary | ICD-10-CM

## 2017-01-21 DIAGNOSIS — O4443 Low lying placenta NOS or without hemorrhage, third trimester: Secondary | ICD-10-CM

## 2017-01-21 DIAGNOSIS — Z23 Encounter for immunization: Secondary | ICD-10-CM | POA: Diagnosis not present

## 2017-01-21 DIAGNOSIS — O4440 Low lying placenta NOS or without hemorrhage, unspecified trimester: Secondary | ICD-10-CM | POA: Insufficient documentation

## 2017-01-21 DIAGNOSIS — Z3483 Encounter for supervision of other normal pregnancy, third trimester: Secondary | ICD-10-CM

## 2017-01-21 NOTE — Progress Notes (Signed)
   PRENATAL VISIT NOTE  Subjective:  Erica Kelly is a 31 y.o. G2P0010 at [redacted]w[redacted]d being seen today for ongoing prenatal care.  She is currently monitored for the following issues for this low-risk pregnancy and has Supervision of normal pregnancy, antepartum; History of loop electrosurgical excision procedure (LEEP) of cervix affecting pregnancy; Bipolar 1 disorder (HCC); Headache; Fetal cardiac echogenic focus; and Low lying placenta, antepartum on her problem list.  Patient reports no complaints.  Contractions: Not present. Vag. Bleeding: None.  Movement: Present. Denies leaking of fluid.   The following portions of the patient's history were reviewed and updated as appropriate: allergies, current medications, past family history, past medical history, past social history, past surgical history and problem list. Problem list updated.  Objective:   Vitals:   01/21/17 0823  BP: 131/89  Pulse: 84  Weight: 149 lb (67.6 kg)    Fetal Status: Fetal Heart Rate (bpm): 152; doppler Fundal Height: 30 cm Movement: Present     General:  Alert, oriented and cooperative. Patient is in no acute distress.  Skin: Skin is warm and dry. No rash noted.   Cardiovascular: Normal heart rate noted  Respiratory: Normal respiratory effort, no problems with respiration noted  Abdomen: Soft, gravid, appropriate for gestational age.  Pain/Pressure: Absent     Pelvic: Cervical exam deferred        Extremities: Normal range of motion.  Edema: Trace  Mental Status:  Normal mood and affect. Normal behavior. Normal judgment and thought content.   Assessment and Plan:  Pregnancy: G2P0010 at [redacted]w[redacted]d  1. Encounter for supervision of normal pregnancy, antepartum, unspecified gravidity      Doing well. Contraception and pediatrician discussed.   - Flu Vaccine QUAD 36+ mos IM (Fluarix, Quad PF)  2. Low lying placenta, antepartum     Has f/u US ordered.    Preterm labor symptoms and general obstetric precautions  including but not limited to vaginal bleeding, contractions, leaking of fluid and fetal movement were reviewed in detail with the patient. Please refer to After Visit Summary for other counseling recommendations.  Return in about 2 weeks (around 02/04/2017) for ROB.   Roe Coombs, CNM

## 2017-01-21 NOTE — Patient Instructions (Addendum)
Contraception Choices Contraception (birth control) is the use of any methods or devices to prevent pregnancy. Below are some methods to help avoid pregnancy. Hormonal methods  Contraceptive implant. This is a thin, plastic tube containing progesterone hormone. It does not contain estrogen hormone. Your health care provider inserts the tube in the inner part of the upper arm. The tube can remain in place for up to 3 years. After 3 years, the implant must be removed. The implant prevents the ovaries from releasing an egg (ovulation), thickens the cervical mucus to prevent sperm from entering the uterus, and thins the lining of the inside of the uterus.  Progesterone-only injections. These injections are given every 3 months by your health care provider to prevent pregnancy. This synthetic progesterone hormone stops the ovaries from releasing eggs. It also thickens cervical mucus and changes the uterine lining. This makes it harder for sperm to survive in the uterus.  Birth control pills. These pills contain estrogen and progesterone hormone. They work by preventing the ovaries from releasing eggs (ovulation). They also cause the cervical mucus to thicken, preventing the sperm from entering the uterus. Birth control pills are prescribed by a health care provider.Birth control pills can also be used to treat heavy periods.  Minipill. This type of birth control pill contains only the progesterone hormone. They are taken every day of each month and must be prescribed by your health care provider.  Birth control patch. The patch contains hormones similar to those in birth control pills. It must be changed once a week and is prescribed by a health care provider.  Vaginal ring. The ring contains hormones similar to those in birth control pills. It is left in the vagina for 3 weeks, removed for 1 week, and then a new one is put back in place. The patient must be comfortable inserting and removing the ring from  the vagina.A health care provider's prescription is necessary.  Emergency contraception. Emergency contraceptives prevent pregnancy after unprotected sexual intercourse. This pill can be taken right after sex or up to 5 days after unprotected sex. It is most effective the sooner you take the pills after having sexual intercourse. Most emergency contraceptive pills are available without a prescription. Check with your pharmacist. Do not use emergency contraception as your only form of birth control. Barrier methods  Female condom. This is a thin sheath (latex or rubber) that is worn over the penis during sexual intercourse. It can be used with spermicide to increase effectiveness.  Female condom. This is a soft, loose-fitting sheath that is put into the vagina before sexual intercourse.  Diaphragm. This is a soft, latex, dome-shaped barrier that must be fitted by a health care provider. It is inserted into the vagina, along with a spermicidal jelly. It is inserted before intercourse. The diaphragm should be left in the vagina for 6 to 8 hours after intercourse.  Cervical cap. This is a round, soft, latex or plastic cup that fits over the cervix and must be fitted by a health care provider. The cap can be left in place for up to 48 hours after intercourse.  Sponge. This is a soft, circular piece of polyurethane foam. The sponge has spermicide in it. It is inserted into the vagina after wetting it and before sexual intercourse.  Spermicides. These are chemicals that kill or block sperm from entering the cervix and uterus. They come in the form of creams, jellies, suppositories, foam, or tablets. They do not require a prescription. They   are inserted into the vagina with an applicator before having sexual intercourse. The process must be repeated every time you have sexual intercourse. Intrauterine contraception  Intrauterine device (IUD). This is a T-shaped device that is put in a woman's uterus during  a menstrual period to prevent pregnancy. There are 2 types: ? Copper IUD. This type of IUD is wrapped in copper wire and is placed inside the uterus. Copper makes the uterus and fallopian tubes produce a fluid that kills sperm. It can stay in place for 10 years. ? Hormone IUD. This type of IUD contains the hormone progestin (synthetic progesterone). The hormone thickens the cervical mucus and prevents sperm from entering the uterus, and it also thins the uterine lining to prevent implantation of a fertilized egg. The hormone can weaken or kill the sperm that get into the uterus. It can stay in place for 3-5 years, depending on which type of IUD is used. Permanent methods of contraception  Female tubal ligation. This is when the woman's fallopian tubes are surgically sealed, tied, or blocked to prevent the egg from traveling to the uterus.  Hysteroscopic sterilization. This involves placing a small coil or insert into each fallopian tube. Your doctor uses a technique called hysteroscopy to do the procedure. The device causes scar tissue to form. This results in permanent blockage of the fallopian tubes, so the sperm cannot fertilize the egg. It takes about 3 months after the procedure for the tubes to become blocked. You must use another form of birth control for these 3 months.  Female sterilization. This is when the female has the tubes that carry sperm tied off (vasectomy).This blocks sperm from entering the vagina during sexual intercourse. After the procedure, the man can still ejaculate fluid (semen). Natural planning methods  Natural family planning. This is not having sexual intercourse or using a barrier method (condom, diaphragm, cervical cap) on days the woman could become pregnant.  Calendar method. This is keeping track of the length of each menstrual cycle and identifying when you are fertile.  Ovulation method. This is avoiding sexual intercourse during ovulation.  Symptothermal method.  This is avoiding sexual intercourse during ovulation, using a thermometer and ovulation symptoms.  Post-ovulation method. This is timing sexual intercourse after you have ovulated. Regardless of which type or method of contraception you choose, it is important that you use condoms to protect against the transmission of sexually transmitted infections (STIs). Talk with your health care provider about which form of contraception is most appropriate for you. This information is not intended to replace advice given to you by your health care provider. Make sure you discuss any questions you have with your health care provider. Document Released: 04/06/2005 Document Revised: 09/12/2015 Document Reviewed: 09/29/2012 Elsevier Interactive Patient Education  2017 Elsevier Inc.   AREA PEDIATRIC/FAMILY PRACTICE PHYSICIANS   [facility] [address] [place], [place]  [address] Phone - [phone]   Fax - [phone]  [facility] [address] [place], [place] [address] Phone - [phone]   Fax - [phone]  [facility] [address] [place], [place]  [address] Phone - [phone]   Fax - [phone]  [facility] [address] [place], [place]  [address] Phone - [phone]   Fax - [phone]  [facility] [address] [place], [place]  [address] Phone - [phone]   Fax - [phone]  [facility] [address]   [place], [place]  [address] Phone - [phone]   Fax - [phone]  [facility] [address] [place], [place]  [address] Phone - [phone]   Fax - [phone]  [facility] [address] [place], [place]  [address] Phone - [phone]   Fax - [phone]  [facility] [address] [place], [place]  [address] Phone - [phone]    Fax - [phone] [facility] [address] [place], [place]  [address] Phone - [phone]   Fax - [phone]  [facility] [address] [place], [place]  [address] Phone - [phone]   Fax - [phone]  [facility] [address] [place], [place]  [address] Phone - [phone]   Fax - [phone]  [facility] [address] [place], [place]  [address] Phone - [phone]   Fax - [phone]  [facility] [address] [place], [place]  [address] Phone - [phone]  [facility] [address] [place], [place]  [address] Phone - [phone]   Fax - [phone]  [facility] [address] [place], [place]  [address] Phone - [phone]   Fax - [phone]  [facility] [address] [place], [place]  [address] Phone - [phone]   Fax - [phone]  [facility] [address] [place], [place]  [address] Phone - [phone]   Fax - [phone]   [facility] [address] [place], [place]  [address] Phone - [phone]   Fax - [phone]  [facility] [address] [place], [place]  [address] Phone - [phone]   Fax - [phone]  [facility] [address] [place], [place]  [address] Phone - [phone]   Fax - [phone]  [facility] [address] , [place]  [address] Phone - [phone]   Fax - [phone]  [facility] [address] , [place]  [address] Phone - [phone]   Fax - [phone] [facility] [address] [place], [place]  [address] Phone - [phone]   Fax - [phone]  [facility] [address] [place], [place]  [address] Phone - [phone]   Fax -  [phone]   [facility] [address] [place],   [address] Phone - [phone]   Fax - [phone]  [facility] Charlene Flemming MD [address]   [address] Phone [phone]  Fax [phone]   

## 2017-01-21 NOTE — Progress Notes (Signed)
Flu vaccine given in right deltoid      Tolerated well

## 2017-02-03 ENCOUNTER — Ambulatory Visit (HOSPITAL_COMMUNITY): Admission: RE | Admit: 2017-02-03 | Payer: Medicaid Other | Source: Ambulatory Visit

## 2017-02-05 ENCOUNTER — Encounter: Payer: Medicaid Other | Admitting: Certified Nurse Midwife

## 2017-02-11 ENCOUNTER — Other Ambulatory Visit: Payer: Self-pay | Admitting: Certified Nurse Midwife

## 2017-02-11 ENCOUNTER — Encounter (HOSPITAL_COMMUNITY): Payer: Self-pay

## 2017-02-11 ENCOUNTER — Other Ambulatory Visit (HOSPITAL_COMMUNITY): Payer: Self-pay | Admitting: *Deleted

## 2017-02-11 ENCOUNTER — Ambulatory Visit (HOSPITAL_COMMUNITY)
Admission: RE | Admit: 2017-02-11 | Discharge: 2017-02-11 | Disposition: A | Discharge status: Home / Self Care | Payer: Medicaid Other | Source: Ambulatory Visit | Attending: Certified Nurse Midwife | Admitting: Certified Nurse Midwife

## 2017-02-11 DIAGNOSIS — Z362 Encounter for other antenatal screening follow-up: Secondary | ICD-10-CM

## 2017-02-11 DIAGNOSIS — O365990 Maternal care for other known or suspected poor fetal growth, unspecified trimester, not applicable or unspecified: Secondary | ICD-10-CM

## 2017-02-11 DIAGNOSIS — O4440 Low lying placenta NOS or without hemorrhage, unspecified trimester: Secondary | ICD-10-CM

## 2017-02-11 DIAGNOSIS — Z3A33 33 weeks gestation of pregnancy: Secondary | ICD-10-CM

## 2017-02-11 DIAGNOSIS — O365930 Maternal care for other known or suspected poor fetal growth, third trimester, not applicable or unspecified: Secondary | ICD-10-CM

## 2017-02-11 DIAGNOSIS — O4443 Low lying placenta NOS or without hemorrhage, third trimester: Secondary | ICD-10-CM | POA: Diagnosis present

## 2017-02-12 ENCOUNTER — Ambulatory Visit (INDEPENDENT_AMBULATORY_CARE_PROVIDER_SITE_OTHER): Payer: Medicaid Other | Admitting: Certified Nurse Midwife

## 2017-02-12 VITALS — BP 134/86 | HR 99 | Wt 156.2 lb

## 2017-02-12 DIAGNOSIS — O0993 Supervision of high risk pregnancy, unspecified, third trimester: Secondary | ICD-10-CM

## 2017-02-12 DIAGNOSIS — O365930 Maternal care for other known or suspected poor fetal growth, third trimester, not applicable or unspecified: Secondary | ICD-10-CM

## 2017-02-12 NOTE — Progress Notes (Signed)
   PRENATAL VISIT NOTE  Subjective:  Erica Kelly is a 31 y.o. G2P0010 at 7975w3d being seen today for ongoing prenatal care.  She is currently monitored for the following issues for this high-risk pregnancy and has Supervision of normal pregnancy, antepartum; History of loop electrosurgical excision procedure (LEEP) of cervix affecting pregnancy; Bipolar 1 disorder (HCC); Headache; Fetal cardiac echogenic focus; Low lying placenta, antepartum; and IUGR (intrauterine growth restriction) affecting care of mother on her problem list.  Patient reports no complaints.  Contractions: Not present. Vag. Bleeding: None.  Movement: Present. Denies leaking of fluid.   The following portions of the patient's history were reviewed and updated as appropriate: allergies, current medications, past family history, past medical history, past social history, past surgical history and problem list. Problem list updated.  Objective:   Vitals:   02/12/17 0814  BP: 134/86  Pulse: 99  Weight: 156 lb 3.2 oz (70.9 kg)    Fetal Status: Fetal Heart Rate (bpm): 125; doppler Fundal Height: 33 cm Movement: Present     General:  Alert, oriented and cooperative. Patient is in no acute distress.  Skin: Skin is warm and dry. No rash noted.   Cardiovascular: Normal heart rate noted  Respiratory: Normal respiratory effort, no problems with respiration noted  Abdomen: Soft, gravid, appropriate for gestational age.  Pain/Pressure: Absent     Pelvic: Cervical exam deferred        Extremities: Normal range of motion.  Edema: Trace  Mental Status:  Normal mood and affect. Normal behavior. Normal judgment and thought content.   Assessment and Plan:  Pregnancy: G2P0010 at 6175w3d  1. Supervision of other normal pregnancy, antepartum     Changed to high risk due to IUGR diagnosed 02/11/17.    2. Poor fetal growth affecting management of mother in third trimester, single or unspecified fetus     Normal BPP/Dopplers on  02/11/17  Preterm labor symptoms and general obstetric precautions including but not limited to vaginal bleeding, contractions, leaking of fluid and fetal movement were reviewed in detail with the patient. Please refer to After Visit Summary for other counseling recommendations.  Return in about 1 week (around 02/19/2017) [facility].   Roe Coombsachelle A Pavan Bring, CNM

## 2017-02-12 NOTE — Patient Instructions (Signed)
AREA PEDIATRIC/FAMILY PRACTICE PHYSICIANS  [facility] [address], [address] [place], [place]  [address] Phone - [phone]   Fax - [phone]  [facility] [address] [place], [place] [address] Phone - [phone]   Fax - [phone]  [facility] AM[facility] [address] [place], [place]  [address] Phone - [phone]   Fax - [phone]  [facility] [address] [place], [place]  [address] Phone - [phone]   Fax - [phone]  [facility] [address] [place], [place]  [address] Phone - [phone]   Fax - [phone]  [facility] [address], [address] [place], [place]  [address] Phone - [phone]   Fax - [phone]  [facility] [address], [address] [place], [place]  [address] Phone - [phone]   Fax - [phone]  [facility] [address], [address] [place], [place]  [address] Phone - [phone]   Fax - [phone]  [facility] [address] [place], [place]  [address] Phone - [phone]   Fax - [phone] [facility] [address] [place], [place]  [address] Phone - [phone]   Fax - [phone]  [facility] [address] [place], [place]  [address] Phone - [phone]   Fax - [phone]  [facility] [address] [place], [place]  [address] Phone - [phone]   Fax - [phone]  [facility] [address], [address] [place], [place]  [address] Phone - [phone]   Fax - [phone]  SHILPA GOSRANI [address] [place], [place]  [address] Phone - [phone]  [facility] [address] [place], [place]  [address] Phone - [phone]   Fax - [phone]  [facility] [address] [place], [place]  [address] Phone - [phone]   Fax - [phone]  [facility] [address] [place], [place]  [address] Phone - [phone]   Fax - [phone]  [facility] [address] [place], [place]  [address] Phone - [phone]   Fax - [phone]   [facility] [address] [place], [place]  [address] Phone - [phone]   Fax - [phone]  [facility] [address] [place], [place]  [address] Phone - [phone]   Fax - [phone]  DAVID RUBIN [address] [place], [place]  [address] Phone - [phone]   Fax - [phone]  [facility] [address] [place], [place]  [address] Phone - [phone]   Fax - [phone]  [facility] [address] [place], [place]  [address] Phone - [phone]   Fax - [phone] [facility] [address] [place], [place]  [address] Phone - [phone]   Fax - [phone]  [facility] [address] [place], [place]  [address] Phone - [phone]   Fax - [phone]  [facility] [address] [place], [place]  [address] Phone - [phone]   Fax - [phone]  [facility] Charlene Flemming MD [address] [place]  [address] Phone [phone]  Fax [phone]   

## 2017-02-12 NOTE — Progress Notes (Signed)
Patient reports good fetal movement, denies pain. 

## 2017-02-14 ENCOUNTER — Encounter (HOSPITAL_COMMUNITY): Payer: Self-pay | Admitting: *Deleted

## 2017-02-14 ENCOUNTER — Inpatient Hospital Stay (HOSPITAL_COMMUNITY): Payer: Medicaid Other | Admitting: Anesthesiology

## 2017-02-14 ENCOUNTER — Inpatient Hospital Stay (HOSPITAL_COMMUNITY)
Admission: AD | Admit: 2017-02-14 | Discharge: 2017-02-16 | DRG: 807 | Disposition: A | Discharge status: Home / Self Care | Payer: Medicaid Other | Source: Ambulatory Visit | Attending: Obstetrics and Gynecology | Admitting: Obstetrics and Gynecology

## 2017-02-14 DIAGNOSIS — Z3A33 33 weeks gestation of pregnancy: Secondary | ICD-10-CM

## 2017-02-14 DIAGNOSIS — F319 Bipolar disorder, unspecified: Secondary | ICD-10-CM | POA: Diagnosis present

## 2017-02-14 DIAGNOSIS — O99344 Other mental disorders complicating childbirth: Secondary | ICD-10-CM | POA: Diagnosis present

## 2017-02-14 DIAGNOSIS — O1494 Unspecified pre-eclampsia, complicating childbirth: Secondary | ICD-10-CM | POA: Diagnosis present

## 2017-02-14 DIAGNOSIS — F17210 Nicotine dependence, cigarettes, uncomplicated: Secondary | ICD-10-CM | POA: Diagnosis present

## 2017-02-14 DIAGNOSIS — O42913 Preterm premature rupture of membranes, unspecified as to length of time between rupture and onset of labor, third trimester: Secondary | ICD-10-CM | POA: Diagnosis present

## 2017-02-14 DIAGNOSIS — O365930 Maternal care for other known or suspected poor fetal growth, third trimester, not applicable or unspecified: Secondary | ICD-10-CM | POA: Diagnosis not present

## 2017-02-14 DIAGNOSIS — O134 Gestational [pregnancy-induced] hypertension without significant proteinuria, complicating childbirth: Secondary | ICD-10-CM

## 2017-02-14 DIAGNOSIS — O42019 Preterm premature rupture of membranes, onset of labor within 24 hours of rupture, unspecified trimester: Secondary | ICD-10-CM

## 2017-02-14 DIAGNOSIS — O99334 Smoking (tobacco) complicating childbirth: Secondary | ICD-10-CM | POA: Diagnosis present

## 2017-02-14 DIAGNOSIS — O42013 Preterm premature rupture of membranes, onset of labor within 24 hours of rupture, third trimester: Secondary | ICD-10-CM | POA: Diagnosis not present

## 2017-02-14 HISTORY — DX: Depression, unspecified: F32.A

## 2017-02-14 HISTORY — DX: Major depressive disorder, single episode, unspecified: F32.9

## 2017-02-14 LAB — URINALYSIS, ROUTINE W REFLEX MICROSCOPIC
Bilirubin Urine: NEGATIVE
Glucose, UA: NEGATIVE mg/dL
KETONES UR: NEGATIVE mg/dL
Nitrite: NEGATIVE
PH: 8 (ref 5.0–8.0)
Protein, ur: 100 mg/dL — AB
SPECIFIC GRAVITY, URINE: 1.006 (ref 1.005–1.030)

## 2017-02-14 LAB — CBC
HCT: 40.7 % (ref 36.0–46.0)
HEMATOCRIT: 41.1 % (ref 36.0–46.0)
HEMOGLOBIN: 14.2 g/dL (ref 12.0–15.0)
Hemoglobin: 13.8 g/dL (ref 12.0–15.0)
MCH: 30.5 pg (ref 26.0–34.0)
MCH: 31.2 pg (ref 26.0–34.0)
MCHC: 33.6 g/dL (ref 30.0–36.0)
MCHC: 34.9 g/dL (ref 30.0–36.0)
MCV: 90.7 fL (ref 78.0–100.0)
MCV: 89.5 fL (ref 78.0–100.0)
PLATELETS: 204 10*3/uL (ref 150–400)
PLATELETS: 221 10*3/uL (ref 150–400)
RBC: 4.53 MIL/uL (ref 3.87–5.11)
RBC: 4.55 MIL/uL (ref 3.87–5.11)
RDW: 14.9 % (ref 11.5–15.5)
RDW: 14.9 % (ref 11.5–15.5)
WBC: 12.8 10*3/uL — AB (ref 4.0–10.5)
WBC: 21.2 10*3/uL — AB (ref 4.0–10.5)

## 2017-02-14 LAB — GROUP B STREP BY PCR: Group B strep by PCR: NEGATIVE

## 2017-02-14 LAB — WET PREP, GENITAL
Clue Cells Wet Prep HPF POC: NONE SEEN
Sperm: NONE SEEN
Trich, Wet Prep: NONE SEEN
Yeast Wet Prep HPF POC: NONE SEEN

## 2017-02-14 LAB — COMPREHENSIVE METABOLIC PANEL
ALK PHOS: 158 U/L — AB (ref 38–126)
ALT: 18 U/L (ref 14–54)
AST: 21 U/L (ref 15–41)
Albumin: 3.3 g/dL — ABNORMAL LOW (ref 3.5–5.0)
Anion gap: 9 (ref 5–15)
BUN: 8 mg/dL (ref 6–20)
CHLORIDE: 103 mmol/L (ref 101–111)
CO2: 22 mmol/L (ref 22–32)
CREATININE: 0.62 mg/dL (ref 0.44–1.00)
Calcium: 9.2 mg/dL (ref 8.9–10.3)
GFR calc Af Amer: >60 mL/min (ref >60)
GFR calc non Af Amer: >60 mL/min (ref >60)
Glucose, Bld: 100 mg/dL — ABNORMAL HIGH (ref 65–99)
Potassium: 3.8 mmol/L (ref 3.5–5.1)
Sodium: 134 mmol/L — ABNORMAL LOW (ref 135–145)
Total Bilirubin: 0.2 mg/dL — ABNORMAL LOW (ref 0.3–1.2)
Total Protein: 7.2 g/dL (ref 6.5–8.1)

## 2017-02-14 LAB — PROTEIN / CREATININE RATIO, URINE
Creatinine, Urine: <10 mg/dL
Total Protein, Urine: 318 mg/dL

## 2017-02-14 LAB — POCT FERN TEST: POCT FERN TEST: POSITIVE

## 2017-02-14 LAB — TYPE AND SCREEN
ABO/RH(D): B POS
ANTIBODY SCREEN: NEGATIVE

## 2017-02-14 LAB — RPR: RPR Ser Ql: NONREACTIVE

## 2017-02-14 MED ORDER — HYDRALAZINE HCL 20 MG/ML IJ SOLN
10.0000 mg | Freq: Once | INTRAMUSCULAR | Status: AC
  Administered 2017-02-14: 10 mg via INTRAMUSCULAR

## 2017-02-14 MED ORDER — ACETAMINOPHEN 325 MG PO TABS
650.0000 mg | ORAL_TABLET | ORAL | Status: DC | PRN
  Administered 2017-02-14 – 2017-02-15 (×2): 650 mg via ORAL
  Filled 2017-02-14 (×2): qty 2

## 2017-02-14 MED ORDER — SIMETHICONE 80 MG PO CHEW: 80.0000 mg | CHEWABLE_TABLET | ORAL | Status: DC | PRN

## 2017-02-14 MED ORDER — OXYTOCIN 40 UNITS IN LACTATED RINGERS INFUSION - SIMPLE MED
1.0000 m[IU]/min | INTRAVENOUS | Status: DC
  Administered 2017-02-14: 2 m[IU]/min via INTRAVENOUS

## 2017-02-14 MED ORDER — LABETALOL HCL 5 MG/ML IV SOLN: 20.0000 mg | INTRAVENOUS | Status: DC | PRN

## 2017-02-14 MED ORDER — LACTATED RINGERS IV SOLN
INTRAVENOUS | Status: DC
  Administered 2017-02-14: 08:00:00 via INTRAVENOUS

## 2017-02-14 MED ORDER — ZOLPIDEM TARTRATE 5 MG PO TABS
5.0000 mg | ORAL_TABLET | Freq: Every evening | ORAL | Status: DC | PRN
  Administered 2017-02-15 – 2017-02-16 (×2): 5 mg via ORAL
  Filled 2017-02-14 (×2): qty 1

## 2017-02-14 MED ORDER — MAGNESIUM SULFATE 40 G IN LACTATED RINGERS - SIMPLE
2.0000 g/h | INTRAVENOUS | Status: DC
  Administered 2017-02-14: 2 g/h via INTRAVENOUS
  Filled 2017-02-14: qty 40

## 2017-02-14 MED ORDER — FENTANYL 2.5 MCG/ML BUPIVACAINE 1/10 % EPIDURAL INFUSION (WH - ANES)
14.0000 mL/h | INTRAMUSCULAR | Status: DC | PRN
  Administered 2017-02-14: 14 mL/h via EPIDURAL
  Filled 2017-02-14: qty 100

## 2017-02-14 MED ORDER — COCONUT OIL OIL: 1.0000 "application " | TOPICAL_OIL | Status: DC | PRN

## 2017-02-14 MED ORDER — SOD CITRATE-CITRIC ACID 500-334 MG/5ML PO SOLN: 30.0000 mL | ORAL | Status: DC | PRN

## 2017-02-14 MED ORDER — MAGNESIUM SULFATE 40 G IN LACTATED RINGERS - SIMPLE
2.0000 g/h | INTRAVENOUS | Status: AC
  Administered 2017-02-15: 2 g/h via INTRAVENOUS
  Filled 2017-02-14: qty 40
  Filled 2017-02-14: qty 500

## 2017-02-14 MED ORDER — WITCH HAZEL-GLYCERIN EX PADS: 1.0000 "application " | MEDICATED_PAD | CUTANEOUS | Status: DC | PRN

## 2017-02-14 MED ORDER — PRENATAL MULTIVITAMIN CH
1.0000 | ORAL_TABLET | Freq: Every day | ORAL | Status: DC
  Administered 2017-02-15: 1 via ORAL
  Filled 2017-02-14: qty 1

## 2017-02-14 MED ORDER — SODIUM CHLORIDE 0.9 % IV SOLN: 250.0000 mL | INTRAVENOUS | Status: DC | PRN

## 2017-02-14 MED ORDER — OXYCODONE-ACETAMINOPHEN 5-325 MG PO TABS: 2.0000 | ORAL_TABLET | ORAL | Status: DC | PRN

## 2017-02-14 MED ORDER — DIBUCAINE 1 % RE OINT: 1.0000 "application " | TOPICAL_OINTMENT | RECTAL | Status: DC | PRN

## 2017-02-14 MED ORDER — ONDANSETRON HCL 4 MG PO TABS: 4.0000 mg | ORAL_TABLET | ORAL | Status: DC | PRN

## 2017-02-14 MED ORDER — OXYTOCIN BOLUS FROM INFUSION
500.0000 mL | Freq: Once | INTRAVENOUS | Status: AC
  Administered 2017-02-14: 500 mL via INTRAVENOUS

## 2017-02-14 MED ORDER — ONDANSETRON HCL 4 MG/2ML IJ SOLN
4.0000 mg | Freq: Four times a day (QID) | INTRAMUSCULAR | Status: DC | PRN
  Administered 2017-02-14: 4 mg via INTRAVENOUS
  Filled 2017-02-14: qty 2

## 2017-02-14 MED ORDER — SODIUM CHLORIDE 0.9% FLUSH: 3.0000 mL | INTRAVENOUS | Status: DC | PRN

## 2017-02-14 MED ORDER — ACETAMINOPHEN 325 MG PO TABS: 650.0000 mg | ORAL_TABLET | ORAL | Status: DC | PRN

## 2017-02-14 MED ORDER — LIDOCAINE HCL (PF) 1 % IJ SOLN
INTRAMUSCULAR | Status: DC | PRN
  Administered 2017-02-14: 7 mL via EPIDURAL
  Administered 2017-02-14: 6 mL via EPIDURAL

## 2017-02-14 MED ORDER — EPHEDRINE 5 MG/ML INJ: 10.0000 mg | INTRAVENOUS | Status: DC | PRN

## 2017-02-14 MED ORDER — MAGNESIUM SULFATE BOLUS VIA INFUSION
4.0000 g | Freq: Once | INTRAVENOUS | Status: AC
  Administered 2017-02-14: 4 g via INTRAVENOUS
  Filled 2017-02-14: qty 500

## 2017-02-14 MED ORDER — FENTANYL CITRATE (PF) 100 MCG/2ML IJ SOLN
100.0000 ug | Freq: Once | INTRAMUSCULAR | Status: AC
  Administered 2017-02-14: 100 ug via INTRAVENOUS

## 2017-02-14 MED ORDER — FLEET ENEMA 7-19 GM/118ML RE ENEM: 1.0000 | ENEMA | RECTAL | Status: DC | PRN

## 2017-02-14 MED ORDER — LACTATED RINGERS IV SOLN
INTRAVENOUS | Status: DC
  Administered 2017-02-14 – 2017-02-15 (×2): via INTRAVENOUS

## 2017-02-14 MED ORDER — OXYCODONE-ACETAMINOPHEN 5-325 MG PO TABS: 1.0000 | ORAL_TABLET | ORAL | Status: DC | PRN

## 2017-02-14 MED ORDER — TETANUS-DIPHTH-ACELL PERTUSSIS 5-2.5-18.5 LF-MCG/0.5 IM SUSP: 0.5000 mL | Freq: Once | INTRAMUSCULAR | Status: DC

## 2017-02-14 MED ORDER — MEASLES, MUMPS & RUBELLA VAC ~~LOC~~ INJ: 0.5000 mL | INJECTION | Freq: Once | SUBCUTANEOUS | Status: DC

## 2017-02-14 MED ORDER — BETAMETHASONE SOD PHOS & ACET 6 (3-3) MG/ML IJ SUSP
12.0000 mg | Freq: Once | INTRAMUSCULAR | Status: AC
  Administered 2017-02-14: 12 mg via INTRAMUSCULAR
  Filled 2017-02-14: qty 2

## 2017-02-14 MED ORDER — SENNOSIDES-DOCUSATE SODIUM 8.6-50 MG PO TABS
2.0000 | ORAL_TABLET | ORAL | Status: DC
  Administered 2017-02-15: 2 via ORAL
  Filled 2017-02-14 (×2): qty 2

## 2017-02-14 MED ORDER — HYDRALAZINE HCL 20 MG/ML IJ SOLN
10.0000 mg | Freq: Once | INTRAMUSCULAR | Status: AC
  Administered 2017-02-14: 10 mg via INTRAVENOUS
  Filled 2017-02-14: qty 1

## 2017-02-14 MED ORDER — BENZOCAINE-MENTHOL 20-0.5 % EX AERO: 1.0000 "application " | INHALATION_SPRAY | CUTANEOUS | Status: DC | PRN

## 2017-02-14 MED ORDER — FENTANYL CITRATE (PF) 100 MCG/2ML IJ SOLN
INTRAMUSCULAR | Status: AC
  Administered 2017-02-14: 100 ug via INTRAVENOUS
  Filled 2017-02-14: qty 2

## 2017-02-14 MED ORDER — OXYTOCIN 40 UNITS IN LACTATED RINGERS INFUSION - SIMPLE MED
2.5000 [IU]/h | INTRAVENOUS | Status: DC
  Filled 2017-02-14: qty 1000

## 2017-02-14 MED ORDER — LACTATED RINGERS IV SOLN: 500.0000 mL | Freq: Once | INTRAVENOUS | Status: DC

## 2017-02-14 MED ORDER — HYDRALAZINE HCL 20 MG/ML IJ SOLN: 5.0000 mg | INTRAMUSCULAR | Status: DC | PRN

## 2017-02-14 MED ORDER — ONDANSETRON HCL 4 MG/2ML IJ SOLN: 4.0000 mg | INTRAMUSCULAR | Status: DC | PRN

## 2017-02-14 MED ORDER — AMPICILLIN SODIUM 2 G IJ SOLR
2.0000 g | Freq: Once | INTRAMUSCULAR | Status: AC
  Administered 2017-02-14: 2 g via INTRAVENOUS
  Filled 2017-02-14: qty 2000

## 2017-02-14 MED ORDER — DIPHENHYDRAMINE HCL 25 MG PO CAPS: 25.0000 mg | ORAL_CAPSULE | Freq: Four times a day (QID) | ORAL | Status: DC | PRN

## 2017-02-14 MED ORDER — PHENYLEPHRINE 40 MCG/ML (10ML) SYRINGE FOR IV PUSH (FOR BLOOD PRESSURE SUPPORT): 80.0000 ug | PREFILLED_SYRINGE | INTRAVENOUS | Status: DC | PRN

## 2017-02-14 MED ORDER — LIDOCAINE HCL (PF) 1 % IJ SOLN
30.0000 mL | INTRAMUSCULAR | Status: DC | PRN
  Filled 2017-02-14: qty 30

## 2017-02-14 MED ORDER — PHENYLEPHRINE 40 MCG/ML (10ML) SYRINGE FOR IV PUSH (FOR BLOOD PRESSURE SUPPORT)
80.0000 ug | PREFILLED_SYRINGE | INTRAVENOUS | Status: DC | PRN
  Filled 2017-02-14: qty 10

## 2017-02-14 MED ORDER — SODIUM CHLORIDE 0.9% FLUSH
3.0000 mL | Freq: Two times a day (BID) | INTRAVENOUS | Status: DC
  Administered 2017-02-15: 3 mL via INTRAVENOUS

## 2017-02-14 MED ORDER — IBUPROFEN 600 MG PO TABS
600.0000 mg | ORAL_TABLET | Freq: Four times a day (QID) | ORAL | Status: DC
  Administered 2017-02-14 – 2017-02-16 (×7): 600 mg via ORAL
  Filled 2017-02-14 (×8): qty 1

## 2017-02-14 MED ORDER — DIPHENHYDRAMINE HCL 50 MG/ML IJ SOLN: 12.5000 mg | INTRAMUSCULAR | Status: DC | PRN

## 2017-02-14 MED ORDER — LACTATED RINGERS IV SOLN: 500.0000 mL | INTRAVENOUS | Status: DC | PRN

## 2017-02-14 MED ORDER — TERBUTALINE SULFATE 1 MG/ML IJ SOLN: 0.2500 mg | Freq: Once | INTRAMUSCULAR | Status: DC | PRN

## 2017-02-14 NOTE — Progress Notes (Signed)
Magnesium sulfate order placed x 22 hours for postpartum seizure prophylaxis. End time 3 pm

## 2017-02-14 NOTE — Anesthesia Preprocedure Evaluation (Signed)
Anesthesia Evaluation  Patient identified by MRN, date of birth, ID band Patient awake    Reviewed: Allergy & Precautions, H&P , NPO status , Patient's Chart, lab work & pertinent test results  Airway Mallampati: II  TM Distance: >3 FB Neck ROM: full    Dental no notable dental hx. (+) Teeth Intact   Pulmonary neg pulmonary ROS, Current Smoker,    Pulmonary exam normal breath sounds clear to auscultation       Cardiovascular negative cardio ROS Normal cardiovascular exam Rhythm:Regular Rate:Normal     Neuro/Psych    GI/Hepatic negative GI ROS, Neg liver ROS,   Endo/Other  negative endocrine ROS  Renal/GU negative Renal ROS  negative genitourinary   Musculoskeletal negative musculoskeletal ROS (+)   Abdominal (+) + obese,   Peds  Hematology negative hematology ROS (+)   Anesthesia Other Findings   Reproductive/Obstetrics (+) Pregnancy                             Anesthesia Physical Anesthesia Plan  ASA: II  Anesthesia Plan: Epidural   Post-op Pain Management:    Induction:   PONV Risk Score and Plan:   Airway Management Planned:   Additional Equipment:   Intra-op Plan:   Post-operative Plan:   Informed Consent: I have reviewed the patients History and Physical, chart, labs and discussed the procedure including the risks, benefits and alternatives for the proposed anesthesia with the patient or authorized representative who has indicated his/her understanding and acceptance.     Plan Discussed with:   Anesthesia Plan Comments:         Anesthesia Quick Evaluation

## 2017-02-14 NOTE — MAU Note (Signed)
Started leaking fld about 0615. Clear fld. Having some contractions for past couple days but thought was baby movement.

## 2017-02-14 NOTE — Anesthesia Pain Management Evaluation Note (Signed)
  CRNA Pain Management Visit Note  Patient: Erica Kelly, 31 y.o., female  "Hello I am a member of the anesthesia team at [facility]. We have an anesthesia team available at all times to provide care throughout the hospital, including epidural management and anesthesia for C-section. I don't know your plan for the delivery whether it a natural birth, water birth, IV sedation, nitrous supplementation, doula or epidural, but we want to meet your pain goals."   1.Was your pain managed to your expectations on prior hospitalizations?   No prior hospitalizations  2.What is your expectation for pain management during this hospitalization?     Epidural  3.How can we help you reach that goal?   Record the patient's initial score and the patient's pain goal.   Pain: 0  Pain Goal: 5 [facility] wants you to be able to say your pain was always managed very well.  Erica Kelly,[name] 02/14/2017

## 2017-02-14 NOTE — Progress Notes (Signed)
Erica Kelly is a 31 y.o. G2P0010 at 3373w5d admitted for premature rupture of membranes and Preterm labor  Subjective: Pt comfortable with epidural.  Calm demeanor.   Objective: BP (!) 145/62   Pulse 83   Temp (!) 97.5 F (36.4 C) (Oral)   Resp 20   Ht 4\' 11"  (1.499 m)   Wt 71.7 kg (158 lb)   LMP 06/23/2016   SpO2 99%   BMI 31.91 kg/m  No intake/output data recorded. Total I/O In: 50 [IV Piggyback:50] Out: 500 [Urine:500]  FHT:  FHR: 120s bpm, variability: moderate,  accelerations:  Present,  decelerations:  Present Occasional variable UC:   irregular, every 3-5 minutes SVE:   Dilation: 3.5 Effacement (%): 80, 90 Station: -2 Exam by:: Erica GlazierJennifer Hamilton RN SROM @ 0430  Assessment / Plan: Induction of labor due to preeclampsia,  progressing well on pitocin  Labor: Progressing on Pitocin, will continue to increase as needed Preeclampsia:  on magnesium sulfate Fetal Wellbeing:  Category II Pain Control:  Epidural I/D:  n/a Anticipated MOD:  NSVD  Cinthya Bowmaker-Kareen, SNM 02/14/2017, 1:41 PM I assessed this pt and agree with the above assessment

## 2017-02-14 NOTE — Progress Notes (Addendum)
Erica Kelly is a 31 y.o. female presenting for PPROM and PTL. @ 33w+5d  OB History    Gravida Para Term Preterm AB Living   2       1     SAB TAB Ectopic Multiple Live Births     1           Past Medical History:  Diagnosis Date  . Anxiety    saw counselor in [place]  . Depression    on meds for bipolar   Past Surgical History:  Procedure Laterality Date  . CERVICAL BIOPSY  W/ LOOP ELECTRODE EXCISION     Family History: family history includes Cancer in her paternal grandfather; Diabetes in her paternal grandmother; Hypertension in her mother. Social History:  reports that she has been smoking Cigarettes.  She has a 2.50 pack-year smoking history. She has never used smokeless tobacco. She reports that she does not drink alcohol or use drugs.     Maternal Diabetes: No Genetic Screening: Normal Maternal Ultrasounds/Referrals: Abnormal:  Findings:   IUGR Fetal Ultrasounds or other Referrals:  Fetal echo, Referred to Materal Fetal Medicine  Maternal Substance Abuse:  No Significant Maternal Medications:  None Significant Maternal Lab Results:  Lab values include: Group B Strep negative Other Comments:  severe range b/p  Review of Systems  Eyes: Negative for blurred vision and double vision.  Gastrointestinal: Positive for abdominal pain. Negative for nausea.       Uterine ctx  Neurological: Negative for headaches.  All other systems reviewed and are negative.  Maternal Medical History:  Reason for admission: Rupture of membranes and contractions.  Nausea.  Contractions: Onset was 3-5 hours ago.   Frequency: irregular.   Duration is approximately 10 minutes.   Perceived severity is moderate.    Fetal activity: Perceived fetal activity is normal.   Last perceived fetal movement was within the past hour.    Prenatal complications: IUGR and placental abnormality.   IUGR @ 33 wk, 14%ile Low lye placenta - resolved  Prenatal Complications - Diabetes:  none.    Dilation: 3.5 Effacement (%): 100 Station: 0 Exam by:: Muhamad CNM Blood pressure (!) 146/85, pulse 84, temperature (!) 97.2 F (36.2 C), temperature source Oral, resp. rate (!) 58, height 4\' 11"  (1.499 m), weight 71.7 kg (158 lb), last menstrual period 06/23/2016.  SROM @ 0430, pt reports clear fluids  Maternal Exam:  Uterine Assessment: Contraction strength is moderate.  Contraction duration is 1 minute. Contraction frequency is regular.   Abdomen: Patient reports the following abdominal tenderness: epigastric.  Introitus: Normal vulva. Normal vagina.  Ferning test: positive.  Amniotic fluid character: clear.  Pelvis: adequate for delivery.   Cervix: Cervix evaluated by digital exam.     Fetal Exam Fetal Monitor Review: Baseline rate: 130s.  Variability: moderate (6-25 bpm).   Pattern: accelerations present and no decelerations.    Fetal State Assessment: Category I - tracings are normal.     Physical Exam  Vitals reviewed. Constitutional: She is oriented to person, place, and time. She appears well-developed and well-nourished.  HENT:  Head: Normocephalic.  Eyes: Pupils are equal, round, and reactive to light.  Neck: Normal range of motion. Neck supple.  Cardiovascular: Normal rate, regular rhythm and normal heart sounds.   Respiratory: Effort normal and breath sounds normal.  GI: Soft. There is tenderness in the epigastric area.  Genitourinary: Vagina normal and uterus normal.  Genitourinary Comments: Gravid uterus  Musculoskeletal: Normal range of motion. She exhibits no  edema.  Neurological: She is alert and oriented to person, place, and time.  Skin: Skin is warm and dry.  Psychiatric: She has a normal mood and affect. Her behavior is normal.    Prenatal labs: ABO, Rh: --/--/B POS (10/28 0755) Antibody: NEG (10/28 0755) Rubella: 1.26 (05/29 1609) RPR: Non Reactive (09/24 1020)  HBsAg: Negative (05/29 1609)  HIV:    GBS:    neg  Assessment/Plan: PPROM @ 33w+5d PTL IUGR @ 33w, 14%ile, AC 3%ile GBS neg  Bipolar disorder  Admit to birthing suites BMZ x 1 administered IV pain medication and epidural if desires Consult with physician for management - Start mag sulfate 4mg /2mg  Anticipate SVD  Cinthya Bowmaker-Kareen, SNM 02/14/2017, 9:44 AM  I assessed this pt and agree with the above assessment  Temp:  [97.2 F (36.2 C)-98.4 F (36.9 C)] 97.5 F (36.4 C) (10/28 1030) Pulse Rate:  [67-115] 104 (10/28 1050) Resp:  [18-58] 22 (10/28 1030) BP: (119-174)/(62-111) 132/62 (10/28 1050) SpO2:  [99 %] 99 % (10/28 1050) Weight:  [158 lb (71.7 kg)] 158 lb (71.7 kg) (10/28 0648) Vital signs included several pressures in severe range up to 174 systolic, and 111 diastolic, with reflexes 3+ by my personal eval, no clonus.  Given that pt has by the time of the more severe pressures she had received IM and IV Labetalol, and was still elevated, I feel that she should be treated as a severe range preeclamptic and be given Mag sulfate for preseumed severe preeclampisia.  Pt has received epidural as well, and pressures are now improved, but I feel pt should be tX D as severe pre-E, and therefore will treat with pitocin to expedite delivery now that Mag Sulfate has stopped the majority of contraction.  `````Attestation of Attending Supervision of Advanced Practitioner: Evaluation and management procedures were performed by the PA/NP/CNM/OB Fellow under my supervision/collaboration. Chart reviewed and agree with management and plan. Which is now   Tyger Oka V 02/14/2017 11:44 AM

## 2017-02-14 NOTE — Anesthesia Procedure Notes (Signed)
Epidural Patient location during procedure: OB Start time: 02/14/2017 9:54 AM End time: 02/14/2017 9:58 AM  Staffing Anesthesiologist: Leilani AbleHATCHETT, Kaliyan Osbourn Performed: anesthesiologist   Preanesthetic Checklist Completed: patient identified, surgical consent, pre-op evaluation, timeout performed, IV checked, risks and benefits discussed and monitors and equipment checked  Epidural Patient position: sitting Prep: site prepped and draped and DuraPrep Patient monitoring: continuous pulse ox and blood pressure Approach: midline Location: L3-L4 Injection technique: LOR air  Needle:  Needle type: Tuohy  Needle gauge: 17 G Needle length: 9 cm and 9 Needle insertion depth: 6 cm Catheter type: closed end flexible Catheter size: 19 Gauge Catheter at skin depth: 11 cm Test dose: negative and Other  Assessment Sensory level: T10 Events: blood not aspirated, injection not painful, no injection resistance, negative IV test and no paresthesia

## 2017-02-14 NOTE — Progress Notes (Signed)
Pt ambulated with nurse to NICU.

## 2017-02-14 NOTE — H&P (Signed)
Erica Kelly is a 31 y.o. female presenting for preterm premature rupture of membranes at 0700 on 02/14/17.  Reports contracting irregularly prior to the rupture.    [facility]  Pregnancy dated by LMP, consistent with 7 wk ultrasound.  Pregnancy complicated by small gestational age fetus with last scan on 02/11/17, 14%ile (3lbs 9 oz). Blood pressure elevated upon arrival, no report of headache, vision changes, or epigastric changes.  Hx of LEEP procedure.    OB History    Gravida Para Term Preterm AB Living   2       1     SAB TAB Ectopic Multiple Live Births     1           Past Medical History:  Diagnosis Date  . Anxiety    saw counselor in [place]  . Depression    on meds for bipolar   Past Surgical History:  Procedure Laterality Date  . CERVICAL BIOPSY  W/ LOOP ELECTRODE EXCISION     Family History: family history includes Cancer in her paternal grandfather; Diabetes in her paternal grandmother; Hypertension in her mother. Social History:  reports that she has been smoking Cigarettes.  She has a 2.50 pack-year smoking history. She has never used smokeless tobacco. She reports that she does not drink alcohol or use drugs.     Maternal Diabetes: No Genetic Screening: Normal Maternal Ultrasounds/Referrals: Abnormal:  Findings:   IUGR, Isolated EIF (echogenic intracardiac focus) Fetal Ultrasounds or other Referrals:  None Maternal Substance Abuse:  No Significant Maternal Medications:  Meds include: Other: [place] Significant Maternal Lab Results:  Lab values include: Other: GBS unknow Other Comments:  None  ROS Maternal Medical History:  Reason for admission: Rupture of membranes and contractions.   Contractions: Onset was 6-12 hours ago.   Frequency: regular.   Perceived severity is moderate.    Fetal activity: Perceived fetal activity is normal.   Last perceived fetal movement was within the past hour.     Prenatal complications: Bleeding, PIH, IUGR and preterm labor.   Prenatal Complications - Diabetes: none.    Dilation: 3.5 Effacement (%): 100 Station: 0 Exam by:: Muhamad CNM Blood pressure (!) 163/92, pulse 67, temperature 98.4 F (36.9 C), resp. rate 18, height 4\' 11"  (1.499 m), weight 158 lb (71.7 kg), last menstrual period 06/23/2016. Maternal Exam:  Uterine Assessment: Contraction strength is moderate.  Contraction frequency is regular.   Abdomen: Patient reports no abdominal tenderness. Estimated fetal weight is 3.5 lbs.   Fetal presentation: vertex  Introitus: Vagina is positive for vaginal discharge (clear fluid, occasionally blood tinged seen on perineum).  Amniotic fluid character: bloody.     Fetal Exam Fetal Monitor Review: Mode: ultrasound.   Baseline rate: 123.  Variability: moderate (6-25 bpm).   Pattern: accelerations present and no decelerations.    Fetal State Assessment: Category I - tracings are normal.     Physical Exam  Constitutional: She is oriented to person, place, and time. She appears well-developed and well-nourished.  HENT:  Head: Normocephalic.  Neck: Normal range of motion. Neck supple.  Cardiovascular: Normal rate, regular rhythm and normal heart sounds.   Respiratory: Effort normal and breath sounds normal. No respiratory distress.  GI: Soft. There is no tenderness.  Genitourinary: No bleeding in the vagina. Vaginal discharge (clear fluid, occasionally blood tinged seen on perineum) found.  Musculoskeletal: Normal range of motion. She exhibits no edema.  Neurological: She is alert  and oriented to person, place, and time. She displays normal reflexes.  Skin: Skin is warm and dry.    Prenatal labs: ABO, Rh: B/Positive/-- (05/29 1609) Antibody: Negative (05/29 1609) Rubella: 1.26 (05/29 1609) RPR: Non Reactive (09/24 1020)  HBsAg: Negative (05/29 1609)  HIV:   Nonreactive GBS:   Unknown  Assessment/Plan: 31 y.o. G2P0010 at  [redacted]w[redacted]d IUP Preterm Premature Rupture of Membranes Gestational Hypertension Hx of LEEP  Small for Gestational Age Bipolar Disorder GBS unknown  Plan: Admit to [facility] Dr. Cleatis Polka notified regarding patient labor status/SGA GBS/wet prep/GC/CT collected BMZ ordered PreX labs pending Ampicillin IV ordered  Rochele Pages 02/14/2017, 8:28 AM

## 2017-02-15 LAB — GC/CHLAMYDIA PROBE AMP (~~LOC~~) NOT AT ARMC
Chlamydia: NEGATIVE
Neisseria Gonorrhea: NEGATIVE

## 2017-02-15 MED ORDER — LURASIDONE HCL 40 MG PO TABS
40.0000 mg | ORAL_TABLET | Freq: Every day | ORAL | Status: DC
  Administered 2017-02-15 – 2017-02-16 (×2): 40 mg via ORAL
  Filled 2017-02-15 (×2): qty 1

## 2017-02-15 NOTE — Progress Notes (Signed)
Post Partum Day 1 Subjective: no complaints, up ad lib and tolerating PO the pt is on Mag sulfate til 2 pm. The baby is reported as on room air. Objective: Blood pressure 140/78, pulse 91, temperature 98.1 F (36.7 C), temperature source Oral, resp. rate 18, height 4\' 11"  (1.499 m), weight 158 lb (71.7 kg), last menstrual period 06/23/2016, SpO2 100 %, unknown if currently breastfeeding.  Physical Exam:  General: alert, cooperative and no distress Lochia: appropriate Uterine Fundus: firm Incision:  DVT Evaluation: No evidence of DVT seen on physical exam. Reflexes 2-3 +  Recent Labs  02/14/17 0755 02/14/17 1200  HGB 13.8 14.2  HCT 41.1 40.7    Assessment/Plan: will d/c Mag at 2 pm Plan for discharge tomorrow and Contraception Mirena   LOS: 1 day   Erica Kelly V 02/15/2017, 7:45 AM

## 2017-02-15 NOTE — Lactation Note (Signed)
This note was copied from a baby's chart. Lactation Consultation Note  Patient Name: Erica Kelly Today's Date: 02/15/2017 Reason for consult: Initial assessment;NICU baby  NICU baby 7523 hours old. Mom on the phone while LC in the room. Mom reports that she is pumping but not really seeing much EBM. Discussed progression of milk coming to volume and supply and demand. Mom reports that she only wants to pump while in the hospital, and then does not intend to pump at home. Mom reports that she understands possible breast engorgement. Enc mom to pump every 2-3 hours for a total of 8-12 times followed by hand expression.   Maternal Data Has patient been taught Hand Expression?: Yes (Per mom.) Does the patient have breastfeeding experience prior to this delivery?: No  Feeding Feeding Type: Donor Breast Milk  LATCH Score                   Interventions    Lactation Tools Discussed/Used Pump Review: Setup, frequency, and cleaning;Milk Storage Initiated by:: bedside RN Date initiated:: 02/14/17   Consult Status Consult Status: Follow-up Date: 02/16/17 Follow-up type: In-patient    Erica Kelly 02/15/2017, 2:36 PM

## 2017-02-15 NOTE — Anesthesia Postprocedure Evaluation (Signed)
Anesthesia Post Note  Patient: Erica Kelly  Procedure(s) Performed: AN AD HOC LABOR EPIDURAL     Patient location during evaluation: [facility] Unit Anesthesia Type: Epidural Level of consciousness: awake and alert and oriented Pain management: pain level controlled Vital Signs Assessment: post-procedure vital signs reviewed and stable Respiratory status: spontaneous breathing and nonlabored ventilation Cardiovascular status: stable Postop Assessment: no headache, no apparent nausea or vomiting, no backache, adequate PO intake, epidural receding and patient able to bend at knees Anesthetic complications: no    Last Vitals:  Vitals:   02/15/17 0600 02/15/17 0700  BP:    Pulse:    Resp: 18 18  Temp:    SpO2:      Last Pain:  Vitals:   02/15/17 0730  TempSrc:   PainSc: Asleep   Pain Goal: Patients Stated Pain Goal: 1 (02/14/17 2118)               Laban EmperorMalinova,Salahuddin Arismendez Hristova

## 2017-02-16 MED ORDER — HYDROCHLOROTHIAZIDE 25 MG PO TABS
25.0000 mg | ORAL_TABLET | Freq: Once | ORAL | Status: AC
  Administered 2017-02-16: 25 mg via ORAL
  Filled 2017-02-16: qty 1

## 2017-02-16 MED ORDER — HYDROCHLOROTHIAZIDE 25 MG PO TABS: 25.0000 mg | ORAL_TABLET | Freq: Every day | ORAL | 0 refills | Status: DC

## 2017-02-16 MED ORDER — IBUPROFEN 600 MG PO TABS: 600.0000 mg | ORAL_TABLET | Freq: Four times a day (QID) | ORAL | 0 refills | Status: AC

## 2017-02-16 NOTE — Progress Notes (Signed)
Discharge instructions reviewed with patient.  Discussed postpartum care, breast care, medications, follow up appointments.  Patient tearful at time of discharge instructions.  Reviewed signs and symptoms of postpartum depression.

## 2017-02-16 NOTE — Progress Notes (Signed)
Patient ID: Erica Kelly, female   DOB: 03/06/1986, 31 y.o.   MRN: [id number]

## 2017-02-16 NOTE — Discharge Instructions (Signed)
Postpartum Care After Vaginal Delivery °The period of time right after you deliver your newborn is called the postpartum period. °What kind of medical care will I receive? °· You may continue to receive fluids and medicines through an IV tube inserted into one of your veins. °· If an incision was made near your vagina (episiotomy) or if you had some vaginal tearing during delivery, cold compresses may be placed on your episiotomy or your tear. This helps to reduce pain and swelling. °· You may be given a squirt bottle to use when you go to the bathroom. You may use this until you are comfortable wiping as usual. To use the squirt bottle, follow these steps: °? Before you urinate, fill the squirt bottle with warm water. Do not use hot water. °? After you urinate, while you are sitting on the toilet, use the squirt bottle to rinse the area around your urethra and vaginal opening. This rinses away any urine and blood. °? You may do this instead of wiping. As you start healing, you may use the squirt bottle before wiping yourself. Make sure to wipe gently. °? Fill the squirt bottle with clean water every time you use the bathroom. °· You will be given sanitary pads to wear. °How can I expect to feel? °· You may not feel the need to urinate for several hours after delivery. °· You will have some soreness and pain in your abdomen and vagina. °· If you are breastfeeding, you may have uterine contractions every time you breastfeed for up to several weeks postpartum. Uterine contractions help your uterus return to its normal size. °· It is normal to have vaginal bleeding (lochia) after delivery. The amount and appearance of lochia is often similar to a menstrual period in the first week after delivery. It will gradually decrease over the next few weeks to a dry, yellow-brown discharge. For most women, lochia stops completely by 6-8 weeks after delivery. Vaginal bleeding can vary from woman to woman. °· Within the first few  days after delivery, you may have breast engorgement. This is when your breasts feel heavy, full, and uncomfortable. Your breasts may also throb and feel hard, tightly stretched, warm, and tender. After this occurs, you may have milk leaking from your breasts. Your health care provider can help you relieve discomfort due to breast engorgement. Breast engorgement should go away within a few days. °· You may feel more sad or worried than normal due to hormonal changes after delivery. These feelings should not last more than a few days. If these feelings do not go away after several days, speak with your health care provider. °How should I care for myself? °· Tell your health care provider if you have pain or discomfort. °· Drink enough water to keep your urine clear or pale yellow. °· Wash your hands thoroughly with soap and water for at least 20 seconds after changing your sanitary pads, after using the toilet, and before holding or feeding your baby. °· If you are not breastfeeding, avoid touching your breasts a lot. Doing this can make your breasts produce more milk. °· If you become weak or lightheaded, or you feel like you might faint, ask for help before: °? Getting out of bed. °? Showering. °· Change your sanitary pads frequently. Watch for any changes in your flow, such as a sudden increase in volume, a change in color, the passing of large blood clots. If you pass a blood clot from your vagina, save it   to show to your health care provider. Do not flush blood clots down the toilet without having your health care provider look at them. °· Make sure that all your vaccinations are up to date. This can help protect you and your baby from getting certain diseases. You may need to have immunizations done before you leave the hospital. °· If desired, talk with your health care provider about methods of family planning or birth control (contraception). °How can I start bonding with my baby? °Spending as much time as  possible with your baby is very important. During this time, you and your baby can get to know each other and develop a bond. Having your baby stay with you in your room (rooming in) can give you time to get to know your baby. Rooming in can also help you become comfortable caring for your baby. Breastfeeding can also help you bond with your baby. °How can I plan for returning home with my baby? °· Make sure that you have a car seat installed in your vehicle. °? Your car seat should be checked by a certified car seat installer to make sure that it is installed safely. °? Make sure that your baby fits into the car seat safely. °· Ask your health care provider any questions you have about caring for yourself or your baby. Make sure that you are able to contact your health care provider with any questions after leaving the hospital. °This information is not intended to replace advice given to you by your health care provider. Make sure you discuss any questions you have with your health care provider. °Document Released: 02/01/2007 Document Revised: 09/09/2015 Document Reviewed: 03/11/2015 °Elsevier Interactive Patient Education © 2018 [employer] Inc[employer] ° °

## 2017-02-16 NOTE — Progress Notes (Signed)
CSW attempted to meet with MOB to complete assessment due to NICU admission, but MOB had already been discharged at this time.  CSW did not see MOB yesterday as she was still on magnesium for most of the day.

## 2017-02-16 NOTE — Discharge Summary (Signed)
OB Discharge Summary     Patient Name: Erica Kelly DOB: 07-Aug-1985 MRN: [id number]  Date of admission: 02/14/2017 Delivering MD: Wyvonnia Dusky D   Date of discharge: 02/16/2017  Admitting diagnosis: 34 wks water broke Intrauterine pregnancy: [redacted]w[redacted]d     Secondary diagnosis:  Active Problems:   Preterm labor Preterm delivery Preeclampsia Additional problems: preterm birth     Discharge diagnosis: Preterm Pregnancy Delivered                                                                                                Post partum procedures:none  Augmentation: Pitocin  Complications: None  Hospital course:  Onset of Labor With Vaginal Delivery     31 y.o. yo G2P0111 at [redacted]w[redacted]d was admitted in Latent Labor on 02/14/2017. Patient had an uncomplicated labor course as follows:  Membrane Rupture Time/Date: 6:15 AM ,02/14/2017   Intrapartum Procedures: Episiotomy: None [1]                                         Lacerations:  None [1]  Patient had a delivery of a Viable infant. 02/14/2017  Information for the patient's newborn:  Alanny, Rivers [[id number]]  Delivery Method: Vaginal, Spontaneous Delivery (Filed from Delivery Summary)  She received IV medication to treat elevated BP and magnesium sulfate, diagnosed with preeclampsia with severe features  Pateint had an uncomplicated postpartum course.  She is ambulating, tolerating a regular diet, passing flatus, and urinating well. Patient is discharged home in stable condition on 02/16/17.   Physical exam  Vitals:   02/16/17 0015 02/16/17 0430 02/16/17 0524 02/16/17 0815  BP:   (!) 139/93 139/84  Pulse:   79 84  Resp: 15 17 17 17   Temp:   99 F (37.2 C) 98.6 F (37 C)  TempSrc:   Oral Oral  SpO2:   100% 100%  Weight:      Height:       General: alert, cooperative and no distress Lochia: appropriate Uterine Fundus: firm Incision: N/A DVT Evaluation: No evidence of DVT seen on physical exam. Labs: Lab  Results  Component Value Date   WBC 21.2 (H) 02/14/2017   HGB 14.2 02/14/2017   HCT 40.7 02/14/2017   MCV 89.5 02/14/2017   PLT 221 02/14/2017   CMP Latest Ref Rng & Units 02/14/2017  Glucose 65 - 99 mg/dL 782(N)  BUN 6 - 20 mg/dL 8  Creatinine 5.62 - 1.30 mg/dL 8.65  Sodium 784 - 696 mmol/L 134(L)  Potassium 3.5 - 5.1 mmol/L 3.8  Chloride 101 - 111 mmol/L 103  CO2 22 - 32 mmol/L 22  Calcium 8.9 - 10.3 mg/dL 9.2  Total Protein 6.5 - 8.1 g/dL 7.2  Total Bilirubin 0.3 - 1.2 mg/dL 2.9(B)  Alkaline Phos 38 - 126 U/L 158(H)  AST 15 - 41 U/L 21  ALT 14 - 54 U/L 18    Discharge instruction: per After Visit Summary and "Baby and Me Booklet".  After visit meds:  Allergies  as of 02/16/2017      Reactions   Bactrim [sulfamethoxazole-trimethoprim] Shortness Of Breath, Swelling   Throat swells shut      Medication List    TAKE these medications   cetirizine 10 MG tablet Commonly known as:  ZYRTEC Take 1 tablet (10 mg total) by mouth daily.   ibuprofen 600 MG tablet Commonly known as:  ADVIL,MOTRIN Take 1 tablet (600 mg total) by mouth every 6 (six) hours.   LATUDA 40 MG Tabs tablet Generic drug:  lurasidone Take 40 mg by mouth daily with breakfast.   prenatal multivitamin Tabs tablet Take 1 tablet by mouth daily at 12 noon.     HCTZ 25 mg daily  Diet: routine diet  Activity: Advance as tolerated. Pelvic rest for 6 weeks.   Outpatient follow up: BP chack  Follow up Appt:Future Appointments Date Time Provider Department Center  03/15/2017 8:45 AM Bing NeighborsHarris, Kimberly S, FNP [facility] None  03/15/2017 1:00 PM Orvilla Cornwallenney, Rachelle A, CNM [facility] None   Follow up Visit:No Follow-up on file.  Postpartum contraception: IUD Mirena  Newborn Data: Live born female  Birth Weight: 3 lb 8.4 oz (1600 g) APGAR: 6, 8  Newborn Delivery   Birth date/time:  02/14/2017 14:44:00 Delivery type:  Vaginal, Spontaneous Delivery      Baby Feeding: Bottle and  Breast Disposition:NICU   02/16/2017 Scheryl DarterJames Celso Granja, MD

## 2017-02-17 ENCOUNTER — Other Ambulatory Visit: Payer: Self-pay | Admitting: Obstetrics and Gynecology

## 2017-02-18 ENCOUNTER — Inpatient Hospital Stay (HOSPITAL_COMMUNITY): Admission: RE | Admit: 2017-02-18 | Payer: Medicaid Other | Source: Ambulatory Visit

## 2017-02-25 ENCOUNTER — Other Ambulatory Visit: Payer: Self-pay

## 2017-02-25 ENCOUNTER — Inpatient Hospital Stay (HOSPITAL_COMMUNITY)
Admission: AD | Admit: 2017-02-25 | Discharge: 2017-02-25 | Disposition: A | Discharge status: Home / Self Care | Payer: Medicaid Other | Source: Ambulatory Visit | Attending: Obstetrics & Gynecology | Admitting: Obstetrics & Gynecology

## 2017-02-25 ENCOUNTER — Encounter (HOSPITAL_COMMUNITY): Payer: Self-pay | Admitting: *Deleted

## 2017-02-25 ENCOUNTER — Other Ambulatory Visit (HOSPITAL_COMMUNITY): Payer: Medicaid Other

## 2017-02-25 DIAGNOSIS — Z882 Allergy status to sulfonamides status: Secondary | ICD-10-CM | POA: Diagnosis not present

## 2017-02-25 DIAGNOSIS — O165 Unspecified maternal hypertension, complicating the puerperium: Secondary | ICD-10-CM

## 2017-02-25 DIAGNOSIS — Z3A33 33 weeks gestation of pregnancy: Secondary | ICD-10-CM | POA: Insufficient documentation

## 2017-02-25 DIAGNOSIS — F17210 Nicotine dependence, cigarettes, uncomplicated: Secondary | ICD-10-CM | POA: Insufficient documentation

## 2017-02-25 DIAGNOSIS — O99333 Smoking (tobacco) complicating pregnancy, third trimester: Secondary | ICD-10-CM | POA: Diagnosis not present

## 2017-02-25 DIAGNOSIS — R030 Elevated blood-pressure reading, without diagnosis of hypertension: Secondary | ICD-10-CM | POA: Diagnosis present

## 2017-02-25 LAB — CBC
HEMATOCRIT: 40.6 % (ref 36.0–46.0)
HEMOGLOBIN: 13.6 g/dL (ref 12.0–15.0)
MCH: 30.2 pg (ref 26.0–34.0)
MCHC: 33.5 g/dL (ref 30.0–36.0)
MCV: 90 fL (ref 78.0–100.0)
PLATELETS: 250 10*3/uL (ref 150–400)
RBC: 4.51 MIL/uL (ref 3.87–5.11)
RDW: 14.6 % (ref 11.5–15.5)
WBC: 8.5 10*3/uL (ref 4.0–10.5)

## 2017-02-25 LAB — URINALYSIS, ROUTINE W REFLEX MICROSCOPIC
BILIRUBIN URINE: NEGATIVE
Glucose, UA: NEGATIVE mg/dL
Ketones, ur: NEGATIVE mg/dL
Nitrite: NEGATIVE
PH: 5.5 (ref 5.0–8.0)
Protein, ur: NEGATIVE mg/dL
SPECIFIC GRAVITY, URINE: 1.01 (ref 1.005–1.030)

## 2017-02-25 LAB — COMPREHENSIVE METABOLIC PANEL
ALK PHOS: 84 U/L (ref 38–126)
ALT: 17 U/L (ref 14–54)
AST: 18 U/L (ref 15–41)
Albumin: 3.8 g/dL (ref 3.5–5.0)
Anion gap: 8 (ref 5–15)
BILIRUBIN TOTAL: 0.4 mg/dL (ref 0.3–1.2)
BUN: 12 mg/dL (ref 6–20)
CALCIUM: 9.3 mg/dL (ref 8.9–10.3)
CO2: 23 mmol/L (ref 22–32)
CREATININE: 0.8 mg/dL (ref 0.44–1.00)
Chloride: 104 mmol/L (ref 101–111)
Glucose, Bld: 87 mg/dL (ref 65–99)
Potassium: 4.9 mmol/L (ref 3.5–5.1)
Sodium: 135 mmol/L (ref 135–145)
Total Protein: 6.8 g/dL (ref 6.5–8.1)

## 2017-02-25 LAB — URINALYSIS, MICROSCOPIC (REFLEX)

## 2017-02-25 LAB — PROTEIN / CREATININE RATIO, URINE
CREATININE, URINE: 68 mg/dL
Protein Creatinine Ratio: 0.1 mg/mg{Cre} (ref 0.00–0.15)
TOTAL PROTEIN, URINE: 7 mg/dL

## 2017-02-25 MED ORDER — NIFEDIPINE 10 MG PO CAPS: 10.0000 mg | ORAL_CAPSULE | ORAL | Status: DC | PRN

## 2017-02-25 MED ORDER — LABETALOL HCL 5 MG/ML IV SOLN
20.0000 mg | INTRAVENOUS | Status: DC | PRN
Start: 2017-02-25 — End: 2017-02-25
  Administered 2017-02-25: 20 mg via INTRAVENOUS
  Filled 2017-02-25: qty 4
  Filled 2017-02-25: qty 8

## 2017-02-25 MED ORDER — NIFEDIPINE ER OSMOTIC RELEASE 30 MG PO TB24: 30.0000 mg | ORAL_TABLET | Freq: Two times a day (BID) | ORAL | 2 refills | Status: DC

## 2017-02-25 MED ORDER — HYDRALAZINE HCL 20 MG/ML IJ SOLN
5.0000 mg | INTRAMUSCULAR | Status: AC | PRN
  Administered 2017-02-25: 10 mg via INTRAVENOUS
  Administered 2017-02-25: 5 mg via INTRAVENOUS
  Filled 2017-02-25 (×2): qty 1

## 2017-02-25 MED ORDER — AMLODIPINE BESYLATE 10 MG PO TABS: 10.0000 mg | ORAL_TABLET | Freq: Every day | ORAL | 2 refills | Status: AC

## 2017-02-25 NOTE — Progress Notes (Signed)
Checked with L Leftowich CNM per previous discussion  about BP's & next dose of labetalol - do not give 2nd dose of labetalol - see new orders

## 2017-02-25 NOTE — MAU Note (Signed)
L Leftowich, CNM notified of pt HR in the 50's _ give Hydralazine vs Labetalol? - order received to give Hydralazine doses prn

## 2017-02-25 NOTE — MAU Note (Signed)
Home nurse came to check b/p and it was elevated. 168/84. Denies any headache or visual changes . Did reports seeing some spots yesterday.

## 2017-02-25 NOTE — MAU Provider Note (Signed)
PP HTN Mag during delivery  Focused orders with hydral and 1 dose of labetalol BP 150/90.   Consult anyanwu  Home on norvasc 10 daily  Chief Complaint: Hypertension   First Provider Initiated Contact with Patient 02/25/17 1414      SUBJECTIVE HPI: Erica Kelly is a 31 y.o. G2P0111 at 11 days postpartum following SVD at 4458w5d for PPROM who presents to maternity admissions reporting elevated BP at home with home health nurse check.  She did have HTN on admission for delivery on 02/14/17 and was treated for preeclampsia based on severe range pressures with magnesium sulfate.  She was discharged with HCTZ but does not report taking the medication or any other medications for blood pressure.  She denies h/a, visual disturbances, or epigastric pain.  Her baby is in the NICU. She reports normal lochia, denies vaginal itching/burning, urinary symptoms, dizziness, n/v, or fever/chills.     HPI  Past Medical History:  Diagnosis Date  . Anxiety    [place] counselor in [place]  . Depression    on meds for bipolar   Past Surgical History:  Procedure Laterality Date  . CERVICAL BIOPSY  W/ LOOP ELECTRODE EXCISION     Social History   Socioeconomic History  . Marital status: Single    Spouse name: Not on file  . Number of children: Not on file  . Years of education: Not on file  . Highest education level: Not on file  Social Needs  . Financial resource strain: Not on file  . Food insecurity - worry: Not on file  . Food insecurity - inability: Not on file  . Transportation needs - medical: Not on file  . Transportation needs - non-medical: Not on file  Occupational History  . Not on file  Tobacco Use  . Smoking status: Current Every Day Smoker    Packs/day: 0.25    Years: 10.00    Pack years: 2.50    Types: Cigarettes  . Smokeless tobacco: Never Used  Substance and Sexual Activity  . Alcohol use: No  . Drug use: No  . Sexual activity: Yes    Partners: Male    Birth  control/protection: None  Other Topics Concern  . Not on file  Social History Narrative  . Not on file   No current facility-administered medications on file prior to encounter.    Current Outpatient Medications on File Prior to Encounter  Medication Sig Dispense Refill  . cetirizine (ZYRTEC) 10 MG tablet Take 1 tablet (10 mg total) by mouth daily. 90 tablet 3  . ibuprofen (ADVIL,MOTRIN) 600 MG tablet Take 1 tablet (600 mg total) by mouth every 6 (six) hours. 30 tablet 0  . lurasidone (LATUDA) 40 MG TABS tablet Take 40 mg by mouth daily with breakfast.    . Prenatal Vit-Fe Fumarate-FA (PRENATAL MULTIVITAMIN) TABS tablet Take 1 tablet by mouth daily at 12 noon. 30 tablet 5   Allergies  Allergen Reactions  . Bactrim [Sulfamethoxazole-Trimethoprim] Shortness Of Breath and Swelling    Throat swells shut    ROS:  Review of Systems  Constitutional: Negative for chills, fatigue and fever.  Eyes: Negative for visual disturbance.  Respiratory: Negative for shortness of breath.   Cardiovascular: Negative for chest pain.  Gastrointestinal: Negative for abdominal pain, nausea and vomiting.  Genitourinary: Negative for difficulty urinating, dysuria, flank pain, pelvic pain, vaginal discharge and vaginal pain.  Neurological: Negative for dizziness and headaches.  Psychiatric/Behavioral: Negative.      I have reviewed  patient's Past Medical Hx, Surgical Hx, Family Hx, Social Hx, medications and allergies.   Physical Exam   Patient Vitals for the past 24 hrs:  BP Temp Pulse Resp SpO2 Height Weight  02/25/17 1550 (!) 156/90 - 72 - - - -  02/25/17 1540 (!) 158/86 - 73 - - - -  02/25/17 1535 (!) 174/101 - 72 18 - - -  02/25/17 1513 (!) 173/81 - 72 - - - -  02/25/17 1509 (!) 160/85 - 76 18 - - -  02/25/17 1444 (!) 156/84 - 76 18 100 % - -  02/25/17 1421 (!) 174/88 - 67 - - - -  02/25/17 1357 (!) 172/92 - 60 18 - - -  02/25/17 1325 (!) 166/108 - (!) 53 18 99 % - -  02/25/17 1258 (!)  170/99 98.5 F (36.9 C) (!) 58 18 - 4\' 11"  (1.499 m) 149 lb (67.6 kg)   Constitutional: Well-developed, well-nourished female in no acute distress.  HEART: normal rate, heart sounds, regular rhythm RESP: normal effort, lung sounds clear and equal bilaterally GI: Abd soft, non-tender. Pos BS x 4 MS: Extremities nontender, no edema, normal ROM Neurologic: Alert and oriented x 4.  GU: Neg CVAT.      LAB RESULTS Results for orders placed or performed during the hospital encounter of 02/25/17 (from the past 24 hour(s))  Urinalysis, Routine w reflex microscopic     Status: Abnormal   Collection Time: 02/25/17  1:01 PM  Result Value Ref Range   Color, Urine YELLOW YELLOW   APPearance CLEAR CLEAR   Specific Gravity, Urine 1.010 1.005 - 1.030   pH 5.5 5.0 - 8.0   Glucose, UA NEGATIVE NEGATIVE mg/dL   Hgb urine dipstick LARGE (A) NEGATIVE   Bilirubin Urine NEGATIVE NEGATIVE   Ketones, ur NEGATIVE NEGATIVE mg/dL   Protein, ur NEGATIVE NEGATIVE mg/dL   Nitrite NEGATIVE NEGATIVE   Leukocytes, UA SMALL (A) NEGATIVE  Protein / creatinine ratio, urine     Status: None   Collection Time: 02/25/17  1:01 PM  Result Value Ref Range   Creatinine, Urine 68.00 mg/dL   Total Protein, Urine 7 mg/dL   Protein Creatinine Ratio 0.10 0.00 - 0.15 mg/mg[Cre]  Urinalysis, Microscopic (reflex)     Status: Abnormal   Collection Time: 02/25/17  1:01 PM  Result Value Ref Range   RBC / HPF 0-5 0 - 5 RBC/hpf   WBC, UA 0-5 0 - 5 WBC/hpf   Bacteria, UA RARE (A) NONE SEEN   Squamous Epithelial / LPF 0-5 (A) NONE SEEN  CBC     Status: None   Collection Time: 02/25/17  2:01 PM  Result Value Ref Range   WBC 8.5 4.0 - 10.5 K/uL   RBC 4.51 3.87 - 5.11 MIL/uL   Hemoglobin 13.6 12.0 - 15.0 g/dL   HCT 16.1 09.6 - 04.5 %   MCV 90.0 78.0 - 100.0 fL   MCH 30.2 26.0 - 34.0 pg   MCHC 33.5 30.0 - 36.0 g/dL   RDW 40.9 81.1 - 91.4 %   Platelets 250 150 - 400 K/uL  Comprehensive metabolic panel     Status: None    Collection Time: 02/25/17  2:01 PM  Result Value Ref Range   Sodium 135 135 - 145 mmol/L   Potassium 4.9 3.5 - 5.1 mmol/L   Chloride 104 101 - 111 mmol/L   CO2 23 22 - 32 mmol/L   Glucose, Bld 87 65 - 99 mg/dL  BUN 12 6 - 20 mg/dL   Creatinine, Ser 1.610.80 0.44 - 1.00 mg/dL   Calcium 9.3 8.9 - 09.610.3 mg/dL   Total Protein 6.8 6.5 - 8.1 g/dL   Albumin 3.8 3.5 - 5.0 g/dL   AST 18 15 - 41 U/L   ALT 17 14 - 54 U/L   Alkaline Phosphatase 84 38 - 126 U/L   Total Bilirubin 0.4 0.3 - 1.2 mg/dL   GFR calc non Af Amer >60 >60 mL/min   GFR calc Af Amer >60 >60 mL/min   Anion gap 8 5 - 15    --/--/B POS (10/28 0755)  IMAGING   MAU Management/MDM: Ordered labs and reviewed results.  Preeclampsia labs wnl with P/C ratio of 0.10. Preeclampsia protocol orders used for severe range blood pressures and Hydralazine x 2 doses IV and labetalol x 1 dose IV given with BP down to 150s/90s.   Pt without preeclampsia symptoms.  Consult Dr Macon LargeAnyanwu with assessment and findings.  D/C home with Rx for Norvasc 10 mg PO daily.  [facility].  Return to MAU with any s/sx of preeclampsia.  Pt discharged with strict preeclampsia precautions.  ASSESSMENT 1. Postpartum hypertension     PLAN Discharge home  Allergies as of 02/25/2017      Reactions   Bactrim [sulfamethoxazole-trimethoprim] Shortness Of Breath, Swelling   Throat swells shut      Medication List    STOP taking these medications   hydrochlorothiazide 25 MG tablet Commonly known as:  HYDRODIURIL     TAKE these medications   amLODipine 10 MG tablet Commonly known as:  NORVASC Take 1 tablet (10 mg total) daily by mouth.   cetirizine 10 MG tablet Commonly known as:  ZYRTEC Take 1 tablet (10 mg total) by mouth daily.   ibuprofen 600 MG tablet Commonly known as:  ADVIL,MOTRIN Take 1 tablet (600 mg total) by mouth every 6 (six) hours.   LATUDA 40 MG Tabs tablet Generic drug:  lurasidone Take 40 mg by mouth  daily with breakfast.   prenatal multivitamin Tabs tablet Take 1 tablet by mouth daily at 12 noon.      Follow-up Information    [facility].   Specialty:  Obstetrics and Gynecology Why:  The office will call you for a blood pressure check visit on Monday, 03/01/17.  Return to MAU with any emergencies. Contact information: [address], [address] [place] [id number] [phone]          Sharen CounterLisa Leftwich-Kirby Certified Nurse-Midwife 02/25/2017  9:34 PM

## 2017-02-26 ENCOUNTER — Telehealth: Payer: Self-pay

## 2017-02-26 NOTE — Telephone Encounter (Signed)
Left message for pt to call to confirm appt for 11/12 for bp check.

## 2017-03-04 ENCOUNTER — Ambulatory Visit (HOSPITAL_COMMUNITY): Payer: Medicaid Other

## 2017-03-12 ENCOUNTER — Other Ambulatory Visit (HOSPITAL_COMMUNITY): Payer: Medicaid Other

## 2017-03-15 ENCOUNTER — Ambulatory Visit (INDEPENDENT_AMBULATORY_CARE_PROVIDER_SITE_OTHER): Payer: Medicaid Other | Admitting: Certified Nurse Midwife

## 2017-03-15 ENCOUNTER — Encounter: Payer: Self-pay | Admitting: Certified Nurse Midwife

## 2017-03-15 ENCOUNTER — Ambulatory Visit: Payer: Medicaid Other | Admitting: Family Medicine

## 2017-03-15 VITALS — BP 110/75 | HR 75 | Wt 147.1 lb

## 2017-03-15 DIAGNOSIS — Z3202 Encounter for pregnancy test, result negative: Secondary | ICD-10-CM | POA: Diagnosis not present

## 2017-03-15 DIAGNOSIS — Z3009 Encounter for other general counseling and advice on contraception: Secondary | ICD-10-CM

## 2017-03-15 DIAGNOSIS — F319 Bipolar disorder, unspecified: Secondary | ICD-10-CM

## 2017-03-15 DIAGNOSIS — Z30017 Encounter for initial prescription of implantable subdermal contraceptive: Secondary | ICD-10-CM

## 2017-03-15 DIAGNOSIS — Z1389 Encounter for screening for other disorder: Secondary | ICD-10-CM | POA: Diagnosis not present

## 2017-03-15 DIAGNOSIS — Z3049 Encounter for surveillance of other contraceptives: Secondary | ICD-10-CM | POA: Diagnosis not present

## 2017-03-15 DIAGNOSIS — Z8751 Personal history of pre-term labor: Secondary | ICD-10-CM

## 2017-03-15 LAB — POCT URINE PREGNANCY: Preg Test, Ur: NEGATIVE

## 2017-03-15 NOTE — Progress Notes (Signed)
Post Partum Exam  Erica Kelly is a 31 y.o. [phone] female who presents for a postpartum visit. She is 4 weeks postpartum following a spontaneous vaginal delivery. I have fully reviewed the prenatal and intrapartum course. The delivery was at 33 gestational weeks and 5 days.  Anesthesia: epidural. Postpartum course has been "ok", pt reports good and bad days. Baby's course has been "ok", newborn still in NICU. Baby is feeding by bottle, Similac per Mother. Bleeding no bleeding. Bowel function is normal. Bladder function is normal. Patient is not sexually active. Contraception method is none. She is requesting Nexplanon Postpartum depression screening:  POSITIVE 14.  Denies any suicidal/homicidal ideations.  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  Recently went up to 60 mg on her medication.  Does not have a f/u appointment scheduled with [facility], encouraged to do so.   The following portions of the patient's history were reviewed and updated as appropriate: allergies, current medications, past family history, past medical history, past social history, past surgical history and problem list.  Review of Systems Pertinent items noted in HPI and remainder of comprehensive ROS otherwise negative.    Objective:  unknown if currently breastfeeding.  General:  alert, cooperative, no distress and tearful   Breasts:  inspection negative, no nipple discharge or bleeding, no masses or nodularity palpable  Lungs: clear to auscultation bilaterally  Heart:  regular rate and rhythm, S1, S2 normal, no murmur, click, rub or gallop  Abdomen: soft, non-tender; bowel sounds normal; no masses,  no organomegaly  Pelvic Exam: Not performed.       Pap smear: 09/15/16: negative Assessment:    Normal 4 week postpartum exam. Pap smear not done at today's visit.   Hx of Bi-Polar disorder   Plan:   1. Contraception: abstinence and Nexplanon placed today. 2.  F/U with [facility] for Bi-Polar disorder 3.  Follow up in: 9 months for annual exam or as needed.

## 2017-03-15 NOTE — Progress Notes (Signed)
Nexplanon Procedure Note   PRE-OP DIAGNOSIS: desired long-term, reversible contraception  POST-OP DIAGNOSIS: Same  PROCEDURE: Nexplanon  placement Performing Provider: Devin Foskey CNM   Patient education prior to procedure, explained risk, benefits of Nexplanon, reviewed alternative options. Patient reported understanding. Gave consent to continue with procedure.   PROCEDURE:  Pregnancy Text :  Negative Site (check):      left arm         Sterile Preparation:   Betadinex3 Lot # [id number]  [id number] Expiration Date 08/2019  Insertion site was selected 8 - 10 cm from medial epicondyle and marked along with guiding site using sterile marker. Procedure area was prepped and draped in a sterile fashion. 1% Lidocaine 1.5 ml given prior to procedure. Nexplanon  was inserted subcutaneously.Needle was removed from the insertion site. Nexplanon capsule was palpated by provider and patient to assure satisfactory placement. And a bandage applied and the arm was wrapped with gauze bandage.     Followup: The patient tolerated the procedure well without complications.  Instructions:  The patient was instructed to remove the dressing in 24 hours and that some bruising is to be expected.  She was advised to use over the counter analgesics as needed for any pain at the site.  She is to keep the area dry for 24 hours and to call if her hand or arm becomes cold, numb, or blue.   Erica Kelly CNM   

## 2017-03-17 ENCOUNTER — Encounter: Payer: Self-pay | Admitting: *Deleted

## 2017-03-18 ENCOUNTER — Other Ambulatory Visit (HOSPITAL_COMMUNITY): Payer: Medicaid Other

## 2018-04-19 ENCOUNTER — Telehealth: Payer: Self-pay

## 2018-04-19 NOTE — Telephone Encounter (Signed)
Returned call, pt wanted to know her blood type.

## 2018-07-30 IMAGING — US US MFM OB TRANSVAGINAL
1 series · 16 of 28 positions shown · non-contrast
Comparison: none

[Series 1: us mfm ob transvaginal · 38 acquisitions, 16 frames shown]
[im 1/38]
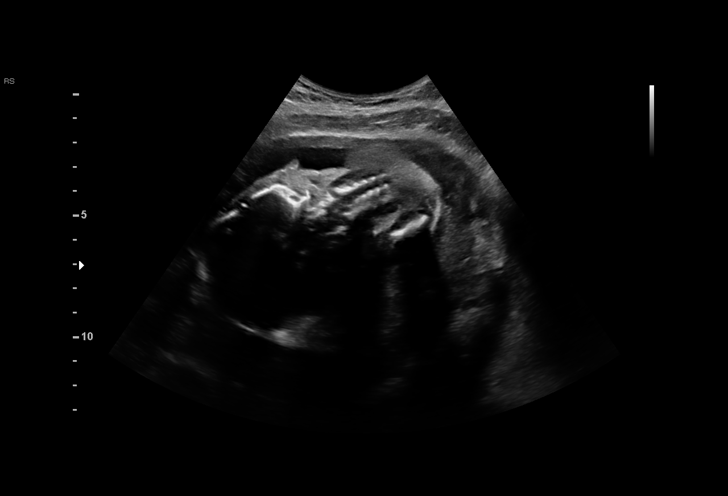
[im 3/38]
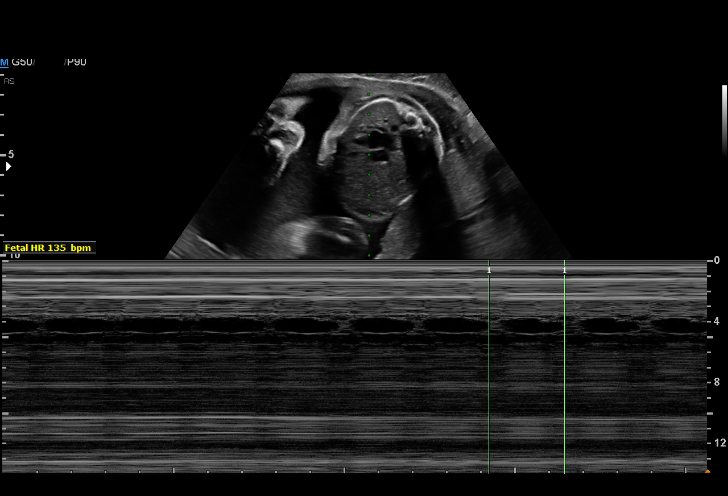
[im 6/38]
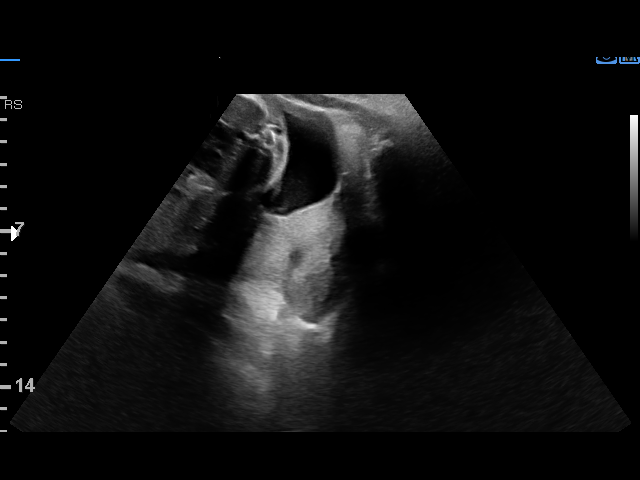
[im 9/38]
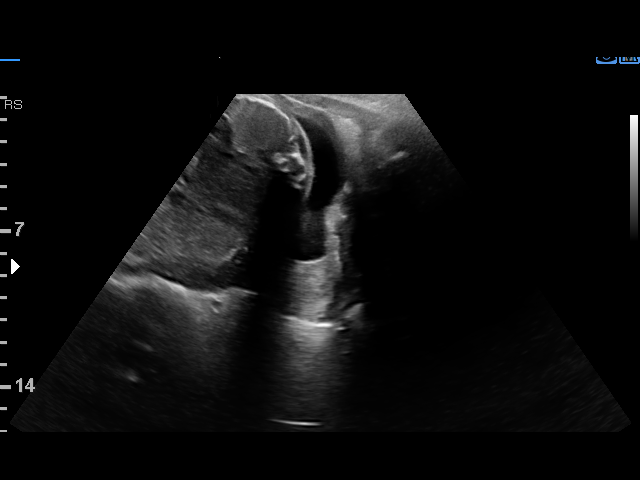
[im 10/38]
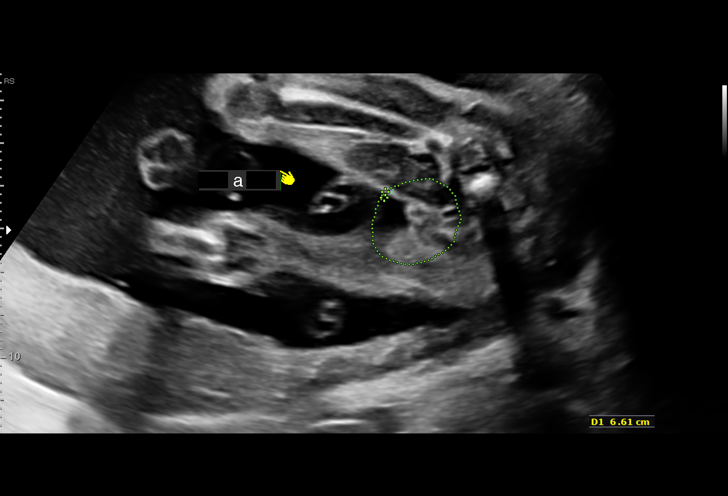
[im 13/38]
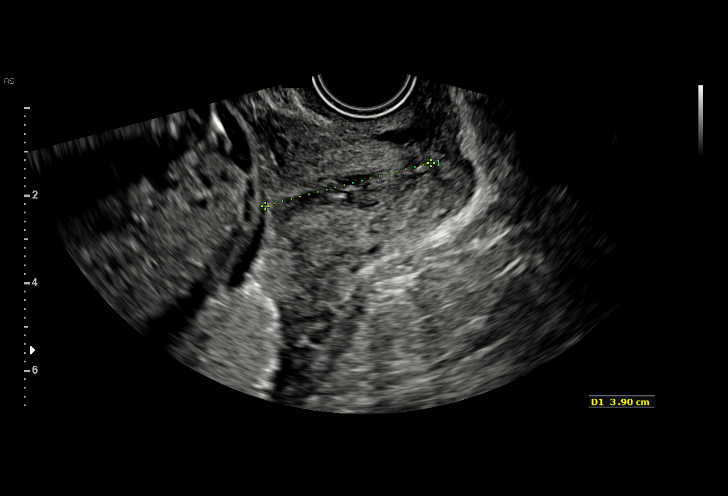
[im 16/38]
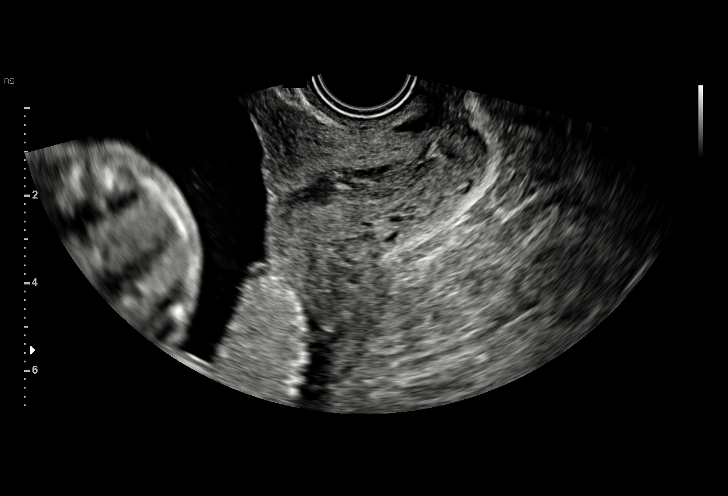
[im 18/38]
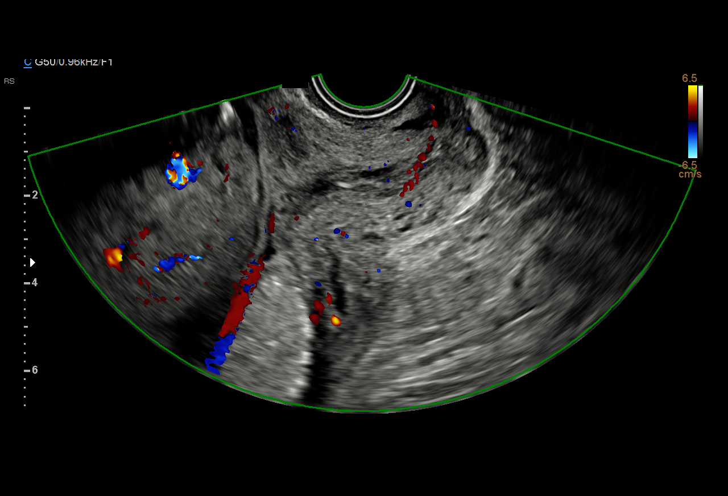
[im 20/38]
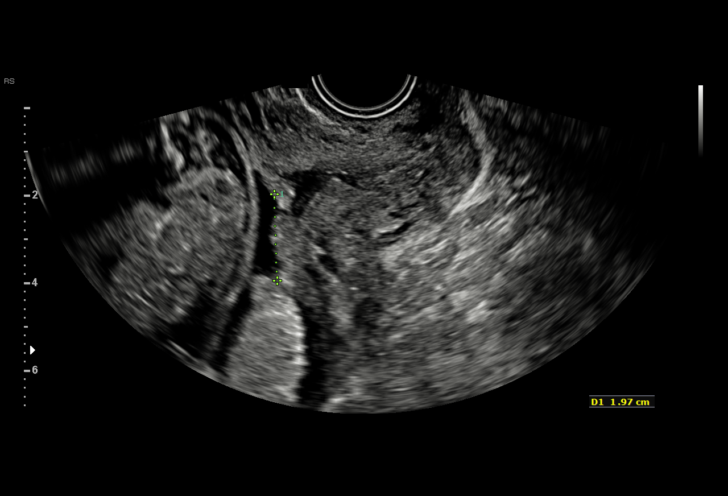
[im 22/38]
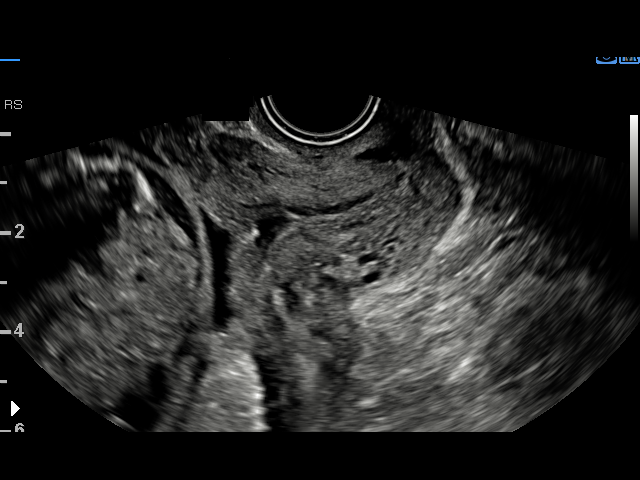
[im 25/38]
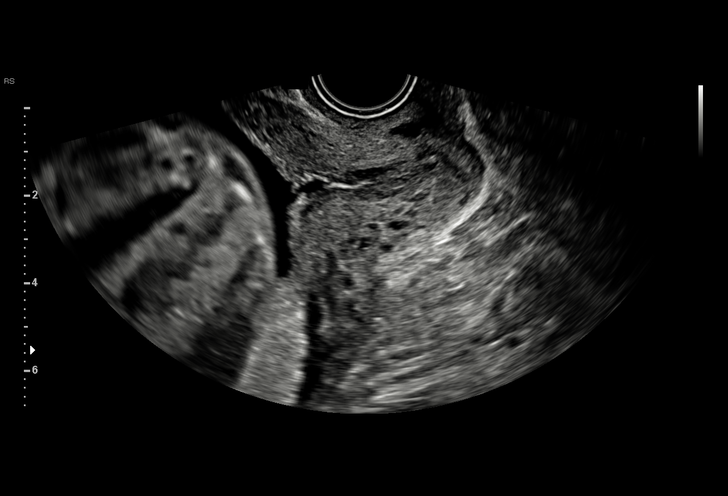
[im 28/38]
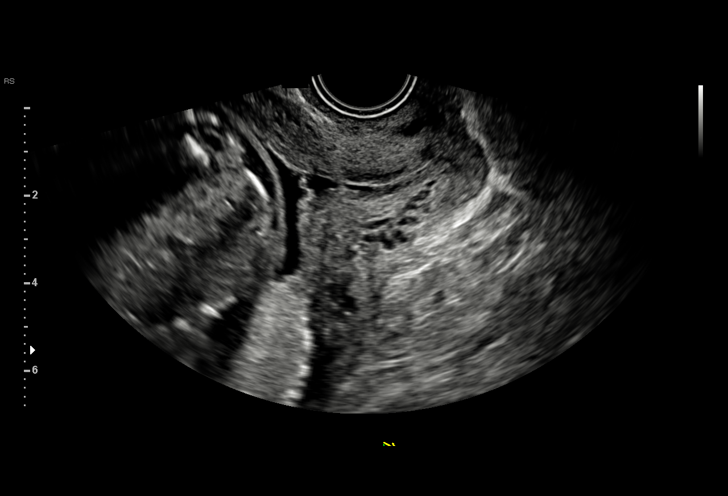
[im 29/38]
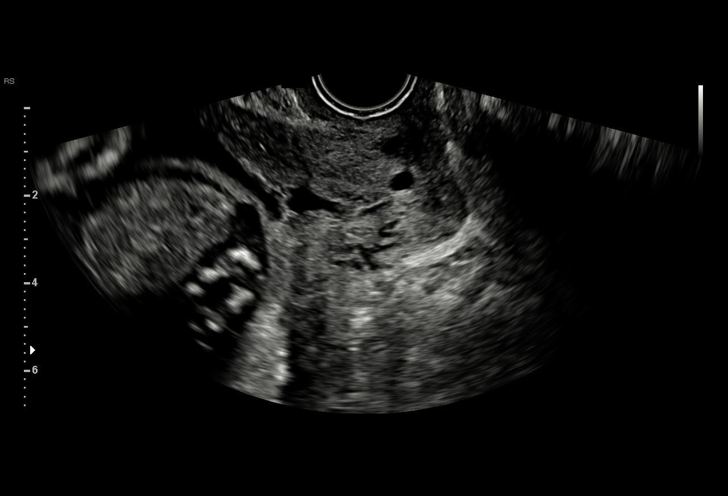
[im 32/38]
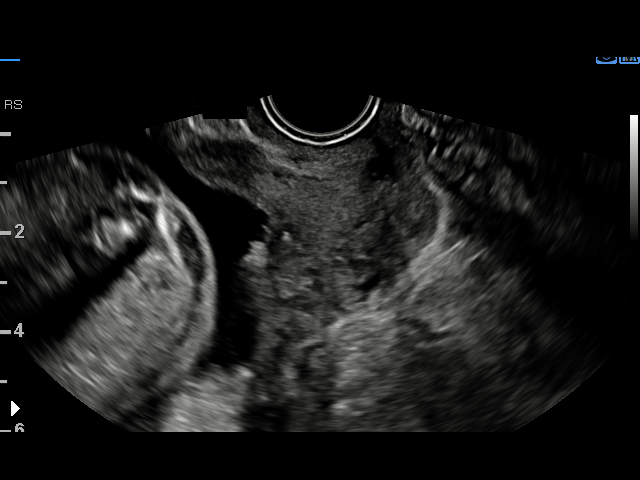
[im 35/38]
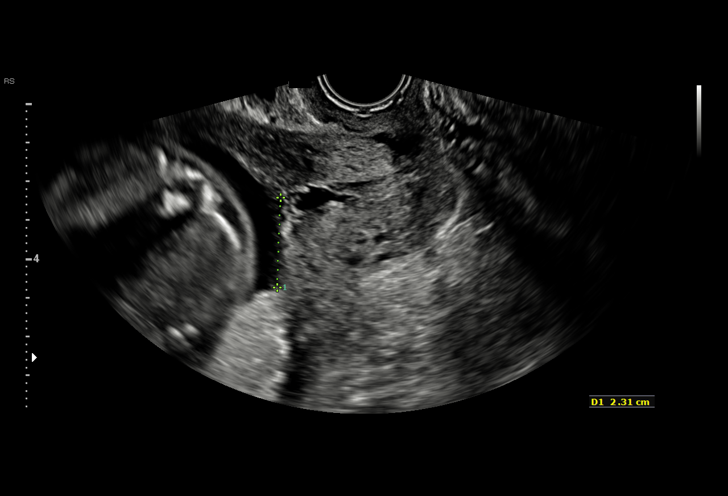
[im 38/38]
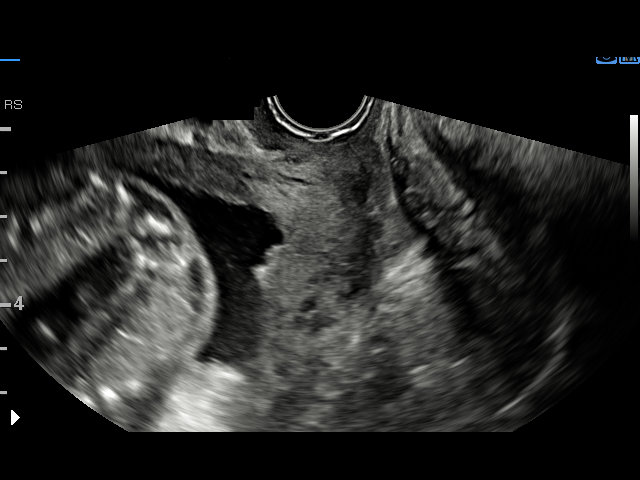

[16 of 28 positions shown; findings below may reference images not displayed]

1  EMETERIO CREWS            [id number]      [id number]     [id number]
Indications

25 weeks gestation of pregnancy
Encounter for cervical length
Previous cervical surgery (LEEP in 8066)
OB History

Blood Type:            Height:  4'11"  Weight (lb):  121      BMI:
Gravidity:    2         Term:   0        Prem:   0        SAB:   0
TOP:          1       Ectopic:  0        Living: 0
Fetal Evaluation

Num Of Fetuses:     1
Fetal Heart         125
Rate(bpm):
Cardiac Activity:   Observed
Presentation:       Transverse, head to maternal left
Placenta:           Posterior, low-lying, 2cm from int os
Gestational Age

LMP:           25w 0d       Date:   06/23/16                 EDD:   03/30/17
Best:          25w 0d    Det. By:   LMP  (06/23/16)          EDD:   03/30/17
Cervix Uterus Adnexa

Cervix
Length:           3.53  cm.
Normal appearance by transvaginal scan
Impression

IUP at 25+0 weeks with history of short cervix, here for
transvaginal cervical length
Transverse presentation
Low lying posterior placenta
Normal transvaginal cervical length at 35mm
Recommendations

Follow-up ultrasounds as clinically indicated.

## 2019-03-20 NOTE — ED Triage Notes (Signed)
Digital Blue Sheet:  Caller: DR Jens Som FOR PATIENT TO COME TO [facility] ED FOR MEDICAL CLEARANCE.    History: DELUSIONAL AND DISORGANIZED THOUGHTS. POLICE CALLED FOR BIZARRE BEHAVIOR AT HOME.  PT NOT WILLING TO TALK TO [employer] STAFF.  NOT RESPONDING TO QUESTIONS.    If needs admit, admit to:   Call back:  No  Call back number:
